# Patient Record
Sex: Male | Born: 1950 | Hispanic: No | Marital: Single
Health system: Southern US, Community
[De-identification: ages and names within clinical notes are randomized; demographics above are authoritative.]

## PROBLEM LIST (undated history)

## (undated) DIAGNOSIS — Z950 Presence of cardiac pacemaker: Secondary | ICD-10-CM

## (undated) DIAGNOSIS — F1910 Other psychoactive substance abuse, uncomplicated: Secondary | ICD-10-CM

## (undated) DIAGNOSIS — J918 Pleural effusion in other conditions classified elsewhere: Secondary | ICD-10-CM

## (undated) DIAGNOSIS — S14109S Unspecified injury at unspecified level of cervical spinal cord, sequela: Secondary | ICD-10-CM

## (undated) DIAGNOSIS — S065XAA Traumatic subdural hemorrhage with loss of consciousness status unknown, initial encounter: Secondary | ICD-10-CM

## (undated) DIAGNOSIS — I469 Cardiac arrest, cause unspecified: Secondary | ICD-10-CM

## (undated) DIAGNOSIS — S065X9A Traumatic subdural hemorrhage with loss of consciousness of unspecified duration, initial encounter: Secondary | ICD-10-CM

## (undated) DIAGNOSIS — J9621 Acute and chronic respiratory failure with hypoxia: Secondary | ICD-10-CM

## (undated) HISTORY — DX: Traumatic subdural hemorrhage with loss of consciousness of unspecified duration, initial encounter: S06.5X9A

## (undated) HISTORY — DX: Acute and chronic respiratory failure with hypoxia: J96.21

## (undated) HISTORY — DX: Liver disease, unspecified: J91.8

## (undated) HISTORY — DX: Cardiac arrest, cause unspecified: I46.9

## (undated) HISTORY — DX: Unspecified injury at unspecified level of cervical spinal cord, sequela: S14.109S

## (undated) HISTORY — DX: Traumatic subdural hemorrhage with loss of consciousness status unknown, initial encounter: S06.5XAA

## (undated) HISTORY — DX: Presence of cardiac pacemaker: Z95.0

## (undated) HISTORY — DX: Other psychoactive substance abuse, uncomplicated: F19.10

---

## 2018-12-06 ENCOUNTER — Other Ambulatory Visit (HOSPITAL_COMMUNITY): Payer: Medicaid Other | Admitting: Internal Medicine

## 2018-12-06 DIAGNOSIS — K769 Liver disease, unspecified: Secondary | ICD-10-CM

## 2018-12-06 DIAGNOSIS — J918 Pleural effusion in other conditions classified elsewhere: Secondary | ICD-10-CM

## 2018-12-06 DIAGNOSIS — F1910 Other psychoactive substance abuse, uncomplicated: Secondary | ICD-10-CM | POA: Diagnosis not present

## 2018-12-06 DIAGNOSIS — S065X0S Traumatic subdural hemorrhage without loss of consciousness, sequela: Secondary | ICD-10-CM

## 2018-12-06 DIAGNOSIS — S14109S Unspecified injury at unspecified level of cervical spinal cord, sequela: Secondary | ICD-10-CM

## 2018-12-06 DIAGNOSIS — J9621 Acute and chronic respiratory failure with hypoxia: Secondary | ICD-10-CM

## 2018-12-08 ENCOUNTER — Other Ambulatory Visit (HOSPITAL_COMMUNITY): Payer: Medicaid Other | Admitting: Internal Medicine

## 2018-12-08 DIAGNOSIS — S14109S Unspecified injury at unspecified level of cervical spinal cord, sequela: Secondary | ICD-10-CM

## 2018-12-08 DIAGNOSIS — J9621 Acute and chronic respiratory failure with hypoxia: Secondary | ICD-10-CM

## 2018-12-08 DIAGNOSIS — K769 Liver disease, unspecified: Secondary | ICD-10-CM

## 2018-12-08 DIAGNOSIS — S065X0S Traumatic subdural hemorrhage without loss of consciousness, sequela: Secondary | ICD-10-CM

## 2018-12-08 DIAGNOSIS — F1910 Other psychoactive substance abuse, uncomplicated: Secondary | ICD-10-CM | POA: Diagnosis not present

## 2018-12-08 DIAGNOSIS — J918 Pleural effusion in other conditions classified elsewhere: Secondary | ICD-10-CM | POA: Diagnosis not present

## 2018-12-09 ENCOUNTER — Other Ambulatory Visit (HOSPITAL_COMMUNITY): Payer: Medicaid Other | Admitting: Internal Medicine

## 2018-12-09 DIAGNOSIS — F1910 Other psychoactive substance abuse, uncomplicated: Secondary | ICD-10-CM

## 2018-12-09 DIAGNOSIS — S065X0S Traumatic subdural hemorrhage without loss of consciousness, sequela: Secondary | ICD-10-CM

## 2018-12-09 DIAGNOSIS — J918 Pleural effusion in other conditions classified elsewhere: Secondary | ICD-10-CM

## 2018-12-09 DIAGNOSIS — J9621 Acute and chronic respiratory failure with hypoxia: Secondary | ICD-10-CM

## 2018-12-09 DIAGNOSIS — S14109S Unspecified injury at unspecified level of cervical spinal cord, sequela: Secondary | ICD-10-CM

## 2018-12-09 DIAGNOSIS — K769 Liver disease, unspecified: Secondary | ICD-10-CM | POA: Diagnosis not present

## 2018-12-10 ENCOUNTER — Other Ambulatory Visit (HOSPITAL_COMMUNITY): Payer: Medicaid Other | Admitting: Internal Medicine

## 2018-12-10 ENCOUNTER — Encounter: Payer: Self-pay | Admitting: Internal Medicine

## 2018-12-10 DIAGNOSIS — F1910 Other psychoactive substance abuse, uncomplicated: Secondary | ICD-10-CM

## 2018-12-10 DIAGNOSIS — S14109S Unspecified injury at unspecified level of cervical spinal cord, sequela: Secondary | ICD-10-CM

## 2018-12-10 DIAGNOSIS — K769 Liver disease, unspecified: Secondary | ICD-10-CM | POA: Insufficient documentation

## 2018-12-10 DIAGNOSIS — J9621 Acute and chronic respiratory failure with hypoxia: Secondary | ICD-10-CM | POA: Insufficient documentation

## 2018-12-10 DIAGNOSIS — J918 Pleural effusion in other conditions classified elsewhere: Secondary | ICD-10-CM

## 2018-12-10 DIAGNOSIS — S065X0S Traumatic subdural hemorrhage without loss of consciousness, sequela: Secondary | ICD-10-CM

## 2018-12-10 DIAGNOSIS — S065XAA Traumatic subdural hemorrhage with loss of consciousness status unknown, initial encounter: Secondary | ICD-10-CM | POA: Insufficient documentation

## 2018-12-10 DIAGNOSIS — S065X9A Traumatic subdural hemorrhage with loss of consciousness of unspecified duration, initial encounter: Secondary | ICD-10-CM | POA: Insufficient documentation

## 2018-12-10 NOTE — Progress Notes (Signed)
[place] NOTE  PULMONARY SERVICE ROUNDS  Date of Service: 12/10/2018  Alexander Scott  DOB: January 21, 1951  Referring physician: Deanne Coffer, MD  HPI: Alexander Scott is a 67 y.o. male  being seen for Acute on Chronic Respiratory Failure.  Patient is still on the ventilator and full support has been on assist control mode is on 28% FiO2  Review of Systems: Unremarkable other than noted in HPI  Allergies:  Reviewed on the [place]  Medications: Reviewed  Vitals: Temperature 97.0 pulse 53 respiratory 20 blood pressure 130/78 saturations 100%  Ventilator Settings: Mode of ventilation assist control FiO2 28% tidal volume 405 PEEP 5  Physical Exam: . General:  calm and comfortable NAD . Eyes: normal lids, irises & conjunctiva . ENT: grossly normal tongue not enlarged . Neck: no masses . Cardiovascular: S1 S2 Normal no rubs no gallop . Respiratory: Coarse breath sounds with a few rhonchi . Abdomen: soft non-distended . Skin: no rash seen on limited exam . Musculoskeletal:  no rigidity . Psychiatric: unable to assess . Neurologic: no involuntary movements          Lab Data and radiological Data:  Sodium 135 potassium 3.8 BUN 26 creatinine 0.6   EXAM: Portable chest AP upright, 21 hours on 12/05/2018  INDICATION: Shortness of breath.  COMPARISON: 11/30/2018  TECHNIQUE: AP portable view of the chest was performed.  FINDINGS: Feeding tube has and removed. Overlying cardiac monitoring leads. Tracheostomy tube again noted.  The patient is tilted to the left. Cardiac silhouette mildly enlarged. Mediastinum remains stable.  No acute bony abnormalities demonstrated.  Small left pleural effusion. Atelectasis and infiltrates in both lung bases, more on the left, with interval change from previously. No right pleural effusion identified.  The left hilum is unremarkable. Right hilum is partially obscured by the adjacent right lower lung  opacities.  IMPRESSION:  1. New left pleural effusion. 2. Bibasilar atelectasis and infiltrates have become more prominent, more on the left side. This partially obscures the right hilum.  Electronically Signed by: Nicholes Stairs, MD, [facility] Radiology Electronically Signed on: 12/06/2018 2:50 AM  Assessment/Plan  Patient Active Problem List   Diagnosis Date Noted  . Acute on chronic respiratory failure with hypoxia ([place])   . Cervical spinal cord injury, sequela ([place])   . Subdural hematoma, post-traumatic ([place])   . Polysubstance abuse ([place])   . Pleural effusion associated with hepatic disorder       1. Acute on chronic respiratory failure with hypoxia patient continues to do fine on assist control mode which will be continued.  We will titrate oxygen as tolerated check.  RSB I if able to tolerate then try to wean. 2. Cervical spinal cord injury at baseline patient has residual paraplegia 3. Subdural hematoma posttraumatic we will continue with supportive care. 4. Polysubstance abuse at baseline we will continue present management no withdrawal 5. Pleural effusion is noted on the chest x-ray to monitor    I have personally evaluated the patient, evaluated the laboratory and imaging results and formulated the assessment and plan and placed orders as needed. The Patient requires high complexity decision making for assessment and support. I have discussed the patient on rounds with the Respiratory Staff   Allyne Gee, MD [facility] Pulmonary Critical Care Medicine

## 2018-12-10 NOTE — Progress Notes (Signed)
Alexander Scott  Date of Service: 12/10/2018  PULMONARY CONSULT   Alexander Scott  DGU:[id number]  DOB: [birth year]     Referring Physician: Deanne Coffer, MD  HPI: Alexander Scott is a 68 y.o. male seen for Acute on Chronic Respiratory Failure.  Patient has multiple medical problems including paraplegia polysubstance abuse bradycardia kidney failure alcoholic cirrhosis who presented to the hospital after being found not able to move his extremities both lower extremities.  Patient apparently had taken a fall on April 30.  At that time he had a tox screen that was positive for alcohol as well as cocaine.  Scans were done which were unremarkable of the spine as well as the brain.  Patient was hypotensive and was transferred to 3 weeks for further evaluation.  Patient apparently was found to be having a traumatic paraplegia had a C3-C6 laminectomy as well as fusion.  Also patient was found to have subdural hematoma however no neurosurgical intervention was done.  Patient did develop respiratory failure was intubated suffered a cardiac arrest also.  Patient was extubated however failed and then had to be reintubated again secondary to secretions.  Subsequently patient ended up having to have a tracheostomy because of retained tracheal secretions.  Right now patient was on the ventilator presentation in assist control mode on full support.  Review of Systems:  ROS performed and is unremarkable other than noted above.  Past medical history: Traumatic quadriplegia Alcohol abuse Polysubstance abuse Cirrhosis  Past surgical history: Tracheostomy Laminectomy and cervical fusion PEG tube placement Back surgery  Social history: Positive for heavy alcohol use positive for tobacco use  Family History: Non-Contributory to the present illness  Allergies  Reviewed on the [facility]  Medications: Reviewed on Rounds  Physical Exam:  Vitals: Temperature 95.0 pulse 62  respiratory 18 blood pressure 170/100 saturations 100%  Ventilator Settings mode of ventilation assist control FiO2 is 28% tidal volume 450  . General: Comfortable at this time . Eyes: Grossly normal lids, irises & conjunctiva . ENT: grossly tongue is normal . Neck: no obvious mass . Cardiovascular: S1-S2 normal no gallop or rub is noted . Respiratory: No rhonchi or rales are noted . Abdomen: Soft and nontender . Skin: no rash seen on limited exam . Musculoskeletal: not rigid . Psychiatric:unable to assess . Neurologic: no seizure no involuntary movements         Labs on Admission:  Sodium 135 potassium 3.8 BUN 26 creatinine 0.3 White count 6.6 hemoglobin 9.8 hematocrit 30.2 platelet count 232 ABG pH 7.41 PCO2 52 PO2 127  Radiological Exams on Admission: Chest film was done showing pleural effusion small  Assessment/Plan Patient Active Problem List   Diagnosis Date Noted  . Acute on chronic respiratory failure with hypoxia ([place])   . Cervical spinal cord injury, sequela ([place])   . Subdural hematoma, post-traumatic ([place])   . Polysubstance abuse ([place])   . Pleural effusion associated with hepatic disorder      1. Acute on chronic respiratory failure with hypoxia at this time patient is requiring full vent support will continue with assist control mode.  Respiratory therapy will assess the RSB I mechanics and try to begin weaning.  Patient's neurological issues may be a problem as far as being able to wean. 2. Cervical spinal injury status post fall patient is status post decompression and fusion.  We will continue with supportive care physical therapy will see the patient 3. Subdural hematoma no neurosurgical intervention required  4. History of polysubstance abuse we will continue to monitor the patient for withdrawal and continue with supportive care overall prognosis is guarded 5. Pleural effusion insignificant we will continue to monitor  I have personally seen and evaluated  the patient, evaluated laboratory and imaging results, formulated the assessment and plan and placed orders. The Patient requires high complexity decision making for assessment and support.  Case was discussed on Rounds with the Respiratory Therapy Staff Time Spent 62minutes  Allyne Gee, MD [facility]

## 2018-12-10 NOTE — Progress Notes (Signed)
[place] NOTE  PULMONARY SERVICE ROUNDS  Date of Service: 12/10/2018  Alexander Scott  DOB: 06-Dec-1950  Referring physician: Deanne Coffer, [name]  Patient was on pressure support wean checking spontaneous breathing trial numbers actually look pretty good  Review of Systems: Unremarkable other than noted in HPI  Allergies:  Reviewed on the [facility]  Medications: Reviewed  Vitals: Temperature 96.8 pulse 67 respiratory rate 20 blood pressure 142/70 saturations 100%  Ventilator Settings: Mode ventilation pressure support FiO2 28%  Physical Exam: . General:  calm and comfortable NAD . Eyes: normal lids, irises & conjunctiva . ENT: grossly normal tongue not enlarged . Neck: no masses . Cardiovascular: S1 S2 Normal no rubs no gallop . Respiratory: Coarse breath sounds no rhonchi . Abdomen: soft non-distended . Skin: no rash seen on limited exam . Musculoskeletal:  no rigidity . Psychiatric: unable to assess . Neurologic: no involuntary movements          Lab Data and radiological Data:  No labs to report  HISTORY:  Follow-up left pleural effusion and pulmonary infiltrate.  PROCEDURE:  AP PORTABLE VIEW OF CHEST  1353 hours  COMPARISON: 12/05/2018   FINDINGS:  Tracheostomy tube tip projects well above carina. Cardiomediastinal silhouette is stable. There are persistent bibasilar pulmonary opacities greater on the left than right. This has improved on the right, changes not apparent on the left.. There is elevation of the left hemidiaphragm. Left costophrenic angle appears less blunted, suggesting improvement in left-sided pleural effusion. Electrodes overlie the chest.  IMPRESSION:  1.  Improvement in right lung base airspace densities. 2. Persistent left base airspace densities without definite change although suspect left-sided pleural  effusion has decreased slightly.  Electronically Signed by: Willis Modena, MD, [facility] Radiology Electronically Signed on: 12/10/2018 2:42 PM  Assessment/Plan  Patient Active Problem List   Diagnosis Date Noted  . Acute on chronic respiratory failure with hypoxia ([place])   . Cervical spinal cord injury, sequela ([place])   . Subdural hematoma, post-traumatic ([place])   . Polysubstance abuse ([place])   . Pleural effusion associated with hepatic disorder       1. Acute on chronic respiratory failure with hypoxia we will continue with pressure support titrate oxygen continue pulmonary toilet. 2. Cervical spinal injury at baseline we will continue with supportive care status post laminectomy 3. Subdural hematoma no evacuation 4. Polysubstance abuse at baseline continue to monitor 5. Pleural effusion at baseline we will continue to follow actually shows improvement   I have personally evaluated the patient, evaluated the laboratory and imaging results and formulated the assessment and plan and placed orders as needed. The Patient requires high complexity decision making for assessment and support. I have discussed the patient on rounds with the Respiratory Staff   Allyne Gee, MD [facility] Pulmonary Critical Care Medicine

## 2018-12-10 NOTE — Progress Notes (Signed)
[name] NOTE  PULMONARY SERVICE ROUNDS  Date of Service: 12/10/2018  Alexander Scott  DOB: 1950-12-11  Referring physician: Deanne Coffer, [name]  Patient apparently coded is now stabilized patient is on the ventilator and full support on assist control mode  Review of Systems: Unremarkable other than noted in HPI  Allergies:  Reviewed on the [facility]  Medications: Reviewed  Vitals: Temperature 98.7 pulse 90 respiratory rate was 18 saturations 98%  Ventilator Settings: Mode of ventilation assist control FiO2 28% tidal volume is 400 PEEP 5  Physical Exam: . General:  calm and comfortable NAD . Eyes: normal lids, irises & conjunctiva . ENT: grossly normal tongue not enlarged . Neck: no masses . Cardiovascular: S1 S2 Normal no rubs no gallop . Respiratory: Coarse breath sounds with a few rhonchi . Abdomen: soft non-distended . Skin: no rash seen on limited exam . Musculoskeletal:  no rigidity . Psychiatric: unable to assess . Neurologic: no involuntary movements          Lab Data and radiological Data:  No labs to report today   Assessment/Plan  Patient Active Problem List   Diagnosis Date Noted  . Acute on chronic respiratory failure with hypoxia ([place])   . Cervical spinal cord injury, sequela ([place])   . Subdural hematoma, post-traumatic ([place])   . Polysubstance abuse ([place])   . Pleural effusion associated with hepatic disorder       1. Acute on chronic respiratory failure with hypoxia patient is going to continue with full vent support for now.  Will continue secretion management supportive care.  Hold off on any weaning. 2. Cervical spinal cord injury we will continue with supportive care and follow along. 3. Subdural hematoma posttraumatic stable no surgical intervention. 4. Polysubstance abuse at baseline we will follow along 5. Pleural  effusion stable we will monitor   I have personally evaluated the patient, evaluated the laboratory and imaging results and formulated the assessment and plan and placed orders as needed. The Patient requires high complexity decision making for assessment and support. I have discussed the patient on rounds with the Respiratory Staff   Allyne Gee, MD [facility] Pulmonary Critical Care Medicine

## 2018-12-11 ENCOUNTER — Other Ambulatory Visit (INDEPENDENT_AMBULATORY_CARE_PROVIDER_SITE_OTHER): Payer: Medicaid Other | Admitting: Internal Medicine

## 2018-12-11 DIAGNOSIS — F1910 Other psychoactive substance abuse, uncomplicated: Secondary | ICD-10-CM | POA: Diagnosis not present

## 2018-12-11 DIAGNOSIS — J918 Pleural effusion in other conditions classified elsewhere: Secondary | ICD-10-CM

## 2018-12-11 DIAGNOSIS — S14109S Unspecified injury at unspecified level of cervical spinal cord, sequela: Secondary | ICD-10-CM

## 2018-12-11 DIAGNOSIS — J9621 Acute and chronic respiratory failure with hypoxia: Secondary | ICD-10-CM

## 2018-12-11 DIAGNOSIS — S065X0S Traumatic subdural hemorrhage without loss of consciousness, sequela: Secondary | ICD-10-CM

## 2018-12-11 DIAGNOSIS — K769 Liver disease, unspecified: Secondary | ICD-10-CM

## 2018-12-11 NOTE — Progress Notes (Signed)
Alexander Scott NOTE  PULMONARY SERVICE ROUNDS  Date of Service: 12/11/2018  Alexander Scott  DOB: Aug 24, 1950  Referring physician: Deanne Coffer, [name]  Patient is on T collar right now comfortable without distress.  Review of Systems: Unremarkable other than noted in HPI  Allergies:  Reviewed on the [facility]  Medications: Reviewed  Vitals: Temperature is 96.4 pulse 60 respiratory 18 blood pressure 138/80 saturations 100%  Ventilator Settings: Off the ventilator on T collar  Physical Exam: . General:  calm and comfortable NAD . Eyes: normal lids, irises & conjunctiva . ENT: grossly normal tongue not enlarged . Neck: no masses . Cardiovascular: S1 S2 Normal no rubs no gallop . Respiratory: No rhonchi or rales are noted at this time . Abdomen: soft non-distended . Skin: no rash seen on limited exam . Musculoskeletal:  no rigidity . Psychiatric: unable to assess . Neurologic: no involuntary movements          Lab Data and radiological Data:  No labs to report today   Assessment/Plan  Patient Active Problem List   Diagnosis Date Noted  . Acute on chronic respiratory failure with hypoxia ([place])   . Cervical spinal cord injury, sequela ([place])   . Subdural hematoma, post-traumatic ([place])   . Polysubstance abuse ([place])   . Pleural effusion associated with hepatic disorder       1. Acute on chronic respiratory failure with hypoxia we will continue with T collar as tolerated titrate oxygen continue pulmonary toilet 2. Cervical spinal injury stable status post laminectomy 3. Subdural hematoma stable we will continue to monitor 4. Polysubstance abuse at baseline 5. Pleural effusion follow radiologically improving   I have personally evaluated the patient, evaluated the laboratory and imaging results and formulated the assessment and plan and placed orders as  needed. The Patient requires high complexity decision making for assessment and support. I have discussed the patient on rounds with the Respiratory Staff   Allyne Gee, MD [facility] Pulmonary Critical Care Medicine

## 2018-12-12 ENCOUNTER — Other Ambulatory Visit (INDEPENDENT_AMBULATORY_CARE_PROVIDER_SITE_OTHER): Payer: Medicaid Other | Admitting: Internal Medicine

## 2018-12-12 DIAGNOSIS — J918 Pleural effusion in other conditions classified elsewhere: Secondary | ICD-10-CM

## 2018-12-12 DIAGNOSIS — F1910 Other psychoactive substance abuse, uncomplicated: Secondary | ICD-10-CM

## 2018-12-12 DIAGNOSIS — K769 Liver disease, unspecified: Secondary | ICD-10-CM | POA: Diagnosis not present

## 2018-12-12 DIAGNOSIS — S14109S Unspecified injury at unspecified level of cervical spinal cord, sequela: Secondary | ICD-10-CM

## 2018-12-12 DIAGNOSIS — J9621 Acute and chronic respiratory failure with hypoxia: Secondary | ICD-10-CM | POA: Diagnosis not present

## 2018-12-12 DIAGNOSIS — S065X0S Traumatic subdural hemorrhage without loss of consciousness, sequela: Secondary | ICD-10-CM

## 2018-12-12 NOTE — Progress Notes (Signed)
[place] NOTE  PULMONARY SERVICE ROUNDS  Date of Service: 12/12/2018  Alexander Scott  DOB: May 14, 1951  Referring physician: Deanne Coffer, [name]  She remains on T collar right now is on 28% FiO2 good saturations are noted.  Review of Systems: Unremarkable other than noted in HPI  Allergies:  Reviewed on the [facility]  Medications: Reviewed  Vitals: 5 pulse 62 respiratory 15 blood pressure 110/70 saturations 100%  Ventilator Settings: Off the ventilator on T collar  Physical Exam: . General:  calm and comfortable NAD . Eyes: normal lids, irises & conjunctiva . ENT: grossly normal tongue not enlarged . Neck: no masses . Cardiovascular: S1 S2 Normal no rubs no gallop . Respiratory: No rhonchi or rales are noted at this time . Abdomen: soft non-distended . Skin: no rash seen on limited exam . Musculoskeletal:  no rigidity . Psychiatric: unable to assess . Neurologic: no involuntary movements          Lab Data and radiological Data:  Sodium 134 potassium 4.4 BUN 33 creatinine 0.4 White count 20.7 hemoglobin 9.6 hematocrit 28.8 platelet count 120   Assessment/Plan  Patient Active Problem List   Diagnosis Date Noted  . Acute on chronic respiratory failure with hypoxia ([place])   . Cervical spinal cord injury, sequela ([place])   . Subdural hematoma, post-traumatic ([place])   . Polysubstance abuse ([place])   . Pleural effusion associated with hepatic disorder       1. Acute on chronic respiratory failure with hypoxia we will continue T collar wean as tolerated. 2. Cervical spinal injury status post laminectomy and fusion 3. Subdural hematoma status post evacuation 4. Polysubstance abuse at baseline 5. Pleural effusion monitoring radiologically   I have personally evaluated the patient, evaluated the laboratory and imaging results and formulated the  assessment and plan and placed orders as needed. The Patient requires high complexity decision making for assessment and support. I have discussed the patient on rounds with the Respiratory Staff   Allyne Gee, MD [facility] Pulmonary Critical Care Medicine

## 2018-12-13 ENCOUNTER — Other Ambulatory Visit (INDEPENDENT_AMBULATORY_CARE_PROVIDER_SITE_OTHER): Payer: Medicaid Other | Admitting: Internal Medicine

## 2018-12-13 DIAGNOSIS — S065X0S Traumatic subdural hemorrhage without loss of consciousness, sequela: Secondary | ICD-10-CM

## 2018-12-13 DIAGNOSIS — J9621 Acute and chronic respiratory failure with hypoxia: Secondary | ICD-10-CM

## 2018-12-13 DIAGNOSIS — F1910 Other psychoactive substance abuse, uncomplicated: Secondary | ICD-10-CM

## 2018-12-13 DIAGNOSIS — J918 Pleural effusion in other conditions classified elsewhere: Secondary | ICD-10-CM

## 2018-12-13 DIAGNOSIS — K769 Liver disease, unspecified: Secondary | ICD-10-CM | POA: Diagnosis not present

## 2018-12-13 DIAGNOSIS — S14109S Unspecified injury at unspecified level of cervical spinal cord, sequela: Secondary | ICD-10-CM

## 2018-12-13 NOTE — Progress Notes (Signed)
[place] NOTE  PULMONARY SERVICE ROUNDS  Date of Service: 12/13/2018  Alexander Scott  DOB: [birth year]  Referring physician: Deanne Coffer, [name]  Patient is on full vent support at this time mechanics were poor not weaning  Review of Systems: Unremarkable other than noted in HPI  Allergies:  Reviewed on the [facility]  Medications: Reviewed  Vitals: Temperature 95.0 pulse 61 respiratory 18 blood pressure 148/80 saturations 100%  Ventilator Settings: Mode of ventilation assist control FiO2 28% tidal volume 472 PEEP 8  Physical Exam: . General:  calm and comfortable NAD . Eyes: normal lids, irises & conjunctiva . ENT: grossly normal tongue not enlarged . Neck: no masses . Cardiovascular: S1 S2 Normal no rubs no gallop . Respiratory: No rhonchi no rales are noted at this time . Abdomen: soft non-distended . Skin: no rash seen on limited exam . Musculoskeletal:  no rigidity . Psychiatric: unable to assess . Neurologic: no involuntary movements          Lab Data and radiological Data:  Sodium 132 potassium 3.9 BUN 29 creatinine 0.3 White count 21.7 hemoglobin 9.6 hematocrit 28.4 platelet count 119   Assessment/Plan  Patient Active Problem List   Diagnosis Date Noted  . Acute on chronic respiratory failure with hypoxia ([place])   . Cervical spinal cord injury, sequela ([place])   . Subdural hematoma, post-traumatic ([place])   . Polysubstance abuse ([place])   . Pleural effusion associated with hepatic disorder       1. Acute on chronic respiratory failure with hypoxia we will continue with full support on the ventilator reassess the spontaneous breathing trial 2. Cervical spinal cord injury fibrillation supportive care prognosis guarded 3. Subdural hematoma at baseline we will continue present management 4. Polysubstance abuse no change 5. Pleural effusion  supportive care   I have personally evaluated the patient, evaluated the laboratory and imaging results and formulated the assessment and plan and placed orders as needed. The Patient requires high complexity decision making for assessment and support. I have discussed the patient on rounds with the Respiratory Staff   Allyne Gee, MD [facility] Pulmonary Critical Care Medicine

## 2018-12-15 ENCOUNTER — Other Ambulatory Visit (HOSPITAL_COMMUNITY): Payer: Medicaid Other | Admitting: Internal Medicine

## 2018-12-15 DIAGNOSIS — J9621 Acute and chronic respiratory failure with hypoxia: Secondary | ICD-10-CM

## 2018-12-15 DIAGNOSIS — F1910 Other psychoactive substance abuse, uncomplicated: Secondary | ICD-10-CM | POA: Diagnosis not present

## 2018-12-15 DIAGNOSIS — S065X0S Traumatic subdural hemorrhage without loss of consciousness, sequela: Secondary | ICD-10-CM

## 2018-12-15 DIAGNOSIS — J918 Pleural effusion in other conditions classified elsewhere: Secondary | ICD-10-CM | POA: Diagnosis not present

## 2018-12-15 DIAGNOSIS — K769 Liver disease, unspecified: Secondary | ICD-10-CM

## 2018-12-15 DIAGNOSIS — S14109S Unspecified injury at unspecified level of cervical spinal cord, sequela: Secondary | ICD-10-CM

## 2018-12-15 NOTE — Progress Notes (Signed)
[place] NOTE  PULMONARY SERVICE ROUNDS  Date of Service: 12/15/2018  Alexander Scott  DOB: 1951-04-05  Referring physician: Deanne Coffer, [name]  Patient is on T collar has been on 28% FiO2 liberated from the ventilator  Review of Systems: Unremarkable other than noted in HPI  Allergies:  Reviewed on the [facility]  Medications: Reviewed  Vitals: Temperature 96.9 pulse 61 respiratory 18 blood pressure 156/74 saturations 100%  Ventilator Settings: Off the ventilator right now on T collar 28% FiO2  Physical Exam: . General:  calm and comfortable NAD . Eyes: normal lids, irises & conjunctiva . ENT: grossly normal tongue not enlarged . Neck: no masses . Cardiovascular: S1 S2 Normal no rubs no gallop . Respiratory: No rhonchi or rales are noted at this time . Abdomen: soft non-distended . Skin: no rash seen on limited exam . Musculoskeletal:  no rigidity . Psychiatric: unable to assess . Neurologic: no involuntary movements          Lab Data and radiological Data:  Sodium 137 potassium 3.8 BUN 26 creatinine 0.3 White count 8.4 hemoglobin 9.4 hematocrit 27.7 platelet count 120   Assessment/Plan  Patient Active Problem List   Diagnosis Date Noted  . Acute on chronic respiratory failure with hypoxia ([place])   . Cervical spinal cord injury, sequela ([place])   . Subdural hematoma, post-traumatic ([place])   . Polysubstance abuse ([place])   . Pleural effusion associated with hepatic disorder       1. Acute on chronic respiratory failure hypoxia continue with T collar patient has been liberated from the ventilator 2. Cervical injury high lesion we will continue with supportive care 3. Subdural hematoma stable continue to monitor 4. Polysubstance abuse patient is at baseline 5. Pleural effusion we will continue to follow along   I have personally evaluated the  patient, evaluated the laboratory and imaging results and formulated the assessment and plan and placed orders as needed. The Patient requires high complexity decision making for assessment and support. I have discussed the patient on rounds with the Respiratory Staff   Allyne Gee, MD [facility] Pulmonary Critical Care Medicine

## 2018-12-16 ENCOUNTER — Other Ambulatory Visit (HOSPITAL_COMMUNITY): Payer: Medicaid Other | Admitting: Internal Medicine

## 2018-12-16 DIAGNOSIS — J9621 Acute and chronic respiratory failure with hypoxia: Secondary | ICD-10-CM | POA: Diagnosis not present

## 2018-12-16 DIAGNOSIS — F1910 Other psychoactive substance abuse, uncomplicated: Secondary | ICD-10-CM

## 2018-12-16 DIAGNOSIS — J918 Pleural effusion in other conditions classified elsewhere: Secondary | ICD-10-CM

## 2018-12-16 DIAGNOSIS — K769 Liver disease, unspecified: Secondary | ICD-10-CM | POA: Diagnosis not present

## 2018-12-16 DIAGNOSIS — S14109S Unspecified injury at unspecified level of cervical spinal cord, sequela: Secondary | ICD-10-CM

## 2018-12-16 DIAGNOSIS — S065X0S Traumatic subdural hemorrhage without loss of consciousness, sequela: Secondary | ICD-10-CM

## 2018-12-16 NOTE — Progress Notes (Signed)
Alexander Scott NOTE  PULMONARY SERVICE ROUNDS  Date of Service: 12/16/2018  Alexander Scott  DOB: [birth year]  Referring physician: Deanne Coffer, [name]  Patient is currently on T collar has been on 28% FiO2 good saturations are noted  Review of Systems: Unremarkable other than noted in HPI  Allergies:  Reviewed on the [facility]  Medications: Reviewed  Vitals: Temperature 96.9 pulse 61 respiratory rate 18 blood pressure 156/74 saturations 100%  Ventilator Settings: Off the ventilator right now on T collar  Physical Exam: . General:  calm and comfortable NAD . Eyes: normal lids, irises & conjunctiva . ENT: grossly normal tongue not enlarged . Neck: no masses . Cardiovascular: S1 S2 Normal no rubs no gallop . Respiratory: Coarse breath sounds scattered rhonchi . Abdomen: soft non-distended . Skin: no rash seen on limited exam . Musculoskeletal:  no rigidity . Psychiatric: unable to assess . Neurologic: no involuntary movements          Lab Data and radiological Data:  ABG pH 7.406 PCO2 54 PO2 82   Assessment/Plan  Patient Active Problem List   Diagnosis Date Noted  . Acute on chronic respiratory failure with hypoxia ([place])   . Cervical spinal cord injury, sequela ([place])   . Subdural hematoma, post-traumatic ([place])   . Polysubstance abuse ([place])   . Pleural effusion associated with hepatic disorder       1. Acute on chronic respiratory failure with hypoxia we will continue with weaning on T collar patient is doing well so far.  Secretions are fair to moderate. 2. Cervical injury high lesion we will continue supportive care therapy as tolerated 3. Subdural hematoma stable we will monitor 4. Polysubstance abuse grossly unchanged 5. Pleural effusion we will continue with supportive care   I have personally evaluated the patient, evaluated the laboratory  and imaging results and formulated the assessment and plan and placed orders as needed. The Patient requires high complexity decision making for assessment and support. I have discussed the patient on rounds with the Respiratory Staff   Allyne Gee, MD [facility] Pulmonary Critical Care Medicine

## 2018-12-17 ENCOUNTER — Other Ambulatory Visit (HOSPITAL_COMMUNITY): Payer: Medicaid Other | Admitting: Internal Medicine

## 2018-12-17 DIAGNOSIS — S065X0S Traumatic subdural hemorrhage without loss of consciousness, sequela: Secondary | ICD-10-CM

## 2018-12-17 DIAGNOSIS — K769 Liver disease, unspecified: Secondary | ICD-10-CM

## 2018-12-17 DIAGNOSIS — J918 Pleural effusion in other conditions classified elsewhere: Secondary | ICD-10-CM

## 2018-12-17 DIAGNOSIS — J9621 Acute and chronic respiratory failure with hypoxia: Secondary | ICD-10-CM

## 2018-12-17 DIAGNOSIS — F1910 Other psychoactive substance abuse, uncomplicated: Secondary | ICD-10-CM

## 2018-12-17 DIAGNOSIS — S14109S Unspecified injury at unspecified level of cervical spinal cord, sequela: Secondary | ICD-10-CM

## 2018-12-17 NOTE — Progress Notes (Signed)
[place] NOTE  PULMONARY SERVICE ROUNDS  Date of Service: 12/17/2018  Alexander Scott  DOB: 1950/09/28  Referring physician: Deanne Coffer, [name]  Patient currently is on T collar good saturations are noted.  Secretions are fair to moderate  Review of Systems: Unremarkable other than noted in HPI  Allergies:  Reviewed on the [facility]  Medications: Reviewed  Vitals: Temperature 96.7 pulse 68 respiratory 18 blood pressure is 138/70 saturations 99%  Ventilator Settings: Off the ventilator right now on T collar  Physical Exam: . General:  calm and comfortable NAD . Eyes: normal lids, irises & conjunctiva . ENT: grossly normal tongue not enlarged . Neck: no masses . Cardiovascular: S1 S2 Normal no rubs no gallop . Respiratory: No rhonchi no rales are noted . Abdomen: soft non-distended . Skin: no rash seen on limited exam . Musculoskeletal:  no rigidity . Psychiatric: unable to assess . Neurologic: no involuntary movements          Lab Data and radiological Data:  No labs to report   Assessment/Plan  Patient Active Problem List   Diagnosis Date Noted  . Acute on chronic respiratory failure with hypoxia ([place])   . Cervical spinal cord injury, sequela ([place])   . Subdural hematoma, post-traumatic ([place])   . Polysubstance abuse ([place])   . Pleural effusion associated with hepatic disorder       1. Acute on chronic respiratory failure with hypoxia continue with T collar trials continue secretion management pulmonary toilet. 2. Cervical spinal injury at baseline we will continue present management 3. Subdural hematoma patient is at baseline we will continue with supportive care. 4. Polysubstance abuse at baseline 5. With fusion continue to monitor radiologically   I have personally evaluated the patient, evaluated the laboratory and imaging results and  formulated the assessment and plan and placed orders as needed. The Patient requires high complexity decision making for assessment and support. I have discussed the patient on rounds with the Respiratory Staff   Allyne Gee, MD [facility] Pulmonary Critical Care Medicine

## 2018-12-18 ENCOUNTER — Other Ambulatory Visit (HOSPITAL_COMMUNITY): Payer: Medicaid Other | Admitting: Internal Medicine

## 2018-12-18 DIAGNOSIS — J9621 Acute and chronic respiratory failure with hypoxia: Secondary | ICD-10-CM

## 2018-12-18 DIAGNOSIS — J918 Pleural effusion in other conditions classified elsewhere: Secondary | ICD-10-CM | POA: Diagnosis not present

## 2018-12-18 DIAGNOSIS — S14109S Unspecified injury at unspecified level of cervical spinal cord, sequela: Secondary | ICD-10-CM

## 2018-12-18 DIAGNOSIS — K769 Liver disease, unspecified: Secondary | ICD-10-CM | POA: Diagnosis not present

## 2018-12-18 DIAGNOSIS — F1910 Other psychoactive substance abuse, uncomplicated: Secondary | ICD-10-CM | POA: Diagnosis not present

## 2018-12-18 DIAGNOSIS — S065X0S Traumatic subdural hemorrhage without loss of consciousness, sequela: Secondary | ICD-10-CM

## 2018-12-18 NOTE — Progress Notes (Signed)
[name] NOTE  PULMONARY SERVICE ROUNDS  Date of Service: 12/18/2018  Alexander Scott  DOB: 08/29/1950  Referring physician: Deanne Coffer, [name]  Patient is on T collar right now has been on 35% FiO2 has a #6 cuffless trach in place after the change today  Review of Systems: Unremarkable other than noted in HPI  Allergies:  Reviewed on the [facility]  Medications: Reviewed  Vitals: Temperature 98.0 pulse 64 respiratory rate 20 blood pressure 110/66 saturations 94%  Ventilator Settings: Patient is currently off the ventilator on T collar  Physical Exam: . General:  calm and comfortable NAD . Eyes: normal lids, irises & conjunctiva . ENT: grossly normal tongue not enlarged . Neck: no masses . Cardiovascular: S1 S2 Normal no rubs no gallop . Respiratory: No rhonchi no rales are noted at this time . Abdomen: soft non-distended . Skin: no rash seen on limited exam . Musculoskeletal:  no rigidity . Psychiatric: unable to assess . Neurologic: no involuntary movements          Lab Data and radiological Data:  No labs to report today   Assessment/Plan  Patient Active Problem List   Diagnosis Date Noted  . Acute on chronic respiratory failure with hypoxia ([place])   . Cervical spinal cord injury, sequela ([place])   . Subdural hematoma, post-traumatic ([place])   . Polysubstance abuse ([place])   . Pleural effusion associated with hepatic disorder       1. Acute on chronic respiratory failure with hypoxia we will continue with T collar trials the trach has been changed to a cuffless trach continue with supportive care 2. Cervical spinal injury supportive care at high lesion 3. Subdural hematoma status post evacuation 4. Polysubstance abuse at baseline 5. Pleural effusion.  Continue to monitor radiologically   I have personally evaluated the patient, evaluated the  laboratory and imaging results and formulated the assessment and plan and placed orders as needed. The Patient requires high complexity decision making for assessment and support. I have discussed the patient on rounds with the Respiratory Staff   Allyne Gee, MD [facility] Pulmonary Critical Care Medicine

## 2018-12-20 ENCOUNTER — Other Ambulatory Visit (HOSPITAL_COMMUNITY): Payer: Medicaid Other | Admitting: Internal Medicine

## 2018-12-20 DIAGNOSIS — K769 Liver disease, unspecified: Secondary | ICD-10-CM

## 2018-12-20 DIAGNOSIS — J9621 Acute and chronic respiratory failure with hypoxia: Secondary | ICD-10-CM

## 2018-12-20 DIAGNOSIS — J918 Pleural effusion in other conditions classified elsewhere: Secondary | ICD-10-CM | POA: Diagnosis not present

## 2018-12-20 DIAGNOSIS — S065X0S Traumatic subdural hemorrhage without loss of consciousness, sequela: Secondary | ICD-10-CM

## 2018-12-20 DIAGNOSIS — F1910 Other psychoactive substance abuse, uncomplicated: Secondary | ICD-10-CM

## 2018-12-20 DIAGNOSIS — S14109S Unspecified injury at unspecified level of cervical spinal cord, sequela: Secondary | ICD-10-CM

## 2018-12-20 NOTE — Progress Notes (Signed)
[place] NOTE  PULMONARY SERVICE ROUNDS  Date of Service: 12/20/2018  Alexander Scott  DOB: [birth year]  Referring physician: Deanne Coffer, [name]  Patient currently is on T collar has been on 40% FiO2 still has some mucus plugging had some desaturations noted  Review of Systems: Unremarkable other than noted in HPI  Allergies:  Reviewed on the [facility]  Medications: Reviewed  Vitals: Temperature 96.2 pulse 86 respiratory 20 blood pressure 126/80 saturations 96%  Ventilator Settings: Off the ventilator on T collar currently  Physical Exam: . General:  calm and comfortable NAD . Eyes: normal lids, irises & conjunctiva . ENT: grossly normal tongue not enlarged . Neck: no masses . Cardiovascular: S1 S2 Normal no rubs no gallop . Respiratory: No rhonchi no rales . Abdomen: soft non-distended . Skin: no rash seen on limited exam . Musculoskeletal:  no rigidity . Psychiatric: unable to assess . Neurologic: no involuntary movements          Lab Data and radiological Data:  No labs to report   Assessment/Plan  Patient Active Problem List   Diagnosis Date Noted  . Acute on chronic respiratory failure with hypoxia ([place])   . Cervical spinal cord injury, sequela ([place])   . Subdural hematoma, post-traumatic ([place])   . Polysubstance abuse ([place])   . Pleural effusion associated with hepatic disorder       1. Acute on chronic respiratory failure with hypoxia we will continue with T collar continue aggressive pulmonary toilet supportive care. 2. Cervical spinal injury at baseline continue to monitor 3. Subdural hematoma posttraumatic we will continue to follow 4. Polysubstance abuse no change 5. Continue to follow radiologically   I have personally evaluated the patient, evaluated the laboratory and imaging results and formulated the assessment and plan and  placed orders as needed. The Patient requires high complexity decision making for assessment and support. I have discussed the patient on rounds with the Respiratory Staff   Allyne Gee, MD [facility] Pulmonary Critical Care Medicine

## 2018-12-22 ENCOUNTER — Other Ambulatory Visit (HOSPITAL_COMMUNITY): Payer: Medicaid Other | Admitting: Internal Medicine

## 2018-12-22 DIAGNOSIS — F1910 Other psychoactive substance abuse, uncomplicated: Secondary | ICD-10-CM | POA: Diagnosis not present

## 2018-12-22 DIAGNOSIS — J9621 Acute and chronic respiratory failure with hypoxia: Secondary | ICD-10-CM | POA: Diagnosis not present

## 2018-12-22 DIAGNOSIS — J918 Pleural effusion in other conditions classified elsewhere: Secondary | ICD-10-CM | POA: Diagnosis not present

## 2018-12-22 DIAGNOSIS — S065X0S Traumatic subdural hemorrhage without loss of consciousness, sequela: Secondary | ICD-10-CM

## 2018-12-22 DIAGNOSIS — K769 Liver disease, unspecified: Secondary | ICD-10-CM | POA: Diagnosis not present

## 2018-12-22 DIAGNOSIS — S14109S Unspecified injury at unspecified level of cervical spinal cord, sequela: Secondary | ICD-10-CM

## 2018-12-22 NOTE — Progress Notes (Signed)
[place] NOTE  PULMONARY SERVICE ROUNDS  Date of Service: 12/22/2018  Alexander Scott  DOB: 1951/04/13  Referring physician: Deanne Coffer, [name]  Patient currently is on T collar liberated from the ventilator still has been having issues with bradycardia and has not been able to be completely liberated.  Awaiting word from cardiology regarding the cardiac pacemaker   Review of Systems: Unremarkable other than noted in HPI  Allergies:  Reviewed on the [facility]  Medications: Reviewed  Vitals: Temperature 96.4 pulse 61 respiratory 20 blood pressure 138/94 saturations 100%  Ventilator Settings: Off the ventilator on T collar right now  Physical Exam: . General:  calm and comfortable NAD . Eyes: normal lids, irises & conjunctiva . ENT: grossly normal tongue not enlarged . Neck: no masses . Cardiovascular: S1 S2 Normal no rubs no gallop . Respiratory: No rhonchi no rales are noted . Abdomen: soft non-distended . Skin: no rash seen on limited exam . Musculoskeletal:  no rigidity . Psychiatric: unable to assess . Neurologic: no involuntary movements          Lab Data and radiological Data:  Sodium 141 potassium 4.7 BUN 51 creatinine 0.4   Assessment/Plan  Patient Active Problem List   Diagnosis Date Noted  . Acute on chronic respiratory failure with hypoxia ([place])   . Cervical spinal cord injury, sequela ([place])   . Subdural hematoma, post-traumatic ([place])   . Polysubstance abuse ([place])   . Pleural effusion associated with hepatic disorder       1. Acute on chronic respiratory failure with hypoxia we will continue with T collar trials continue secretion management pulmonary toilet 2. Cervical spinal injury grossly unchanged 3. Subdural hematoma at baseline we will continue to follow 4. Polysubstance abuse 5. Stable no change 6. Pleural effusion  follow-up x-ray was showing bilateral haziness probably some fluid present also still   I have personally evaluated the patient, evaluated the laboratory and imaging results and formulated the assessment and plan and placed orders as needed. The Patient requires high complexity decision making for assessment and support. I have discussed the patient on rounds with the Respiratory Staff   Allyne Gee, MD [facility] Pulmonary Critical Care Medicine

## 2018-12-31 ENCOUNTER — Encounter: Payer: Self-pay | Admitting: Internal Medicine

## 2018-12-31 ENCOUNTER — Other Ambulatory Visit (HOSPITAL_COMMUNITY): Payer: Medicaid Other | Admitting: Internal Medicine

## 2018-12-31 DIAGNOSIS — K769 Liver disease, unspecified: Secondary | ICD-10-CM | POA: Diagnosis not present

## 2018-12-31 DIAGNOSIS — I469 Cardiac arrest, cause unspecified: Secondary | ICD-10-CM

## 2018-12-31 DIAGNOSIS — Z950 Presence of cardiac pacemaker: Secondary | ICD-10-CM

## 2018-12-31 DIAGNOSIS — S14109S Unspecified injury at unspecified level of cervical spinal cord, sequela: Secondary | ICD-10-CM

## 2018-12-31 DIAGNOSIS — J9621 Acute and chronic respiratory failure with hypoxia: Secondary | ICD-10-CM

## 2018-12-31 DIAGNOSIS — F1910 Other psychoactive substance abuse, uncomplicated: Secondary | ICD-10-CM

## 2018-12-31 DIAGNOSIS — S065X0S Traumatic subdural hemorrhage without loss of consciousness, sequela: Secondary | ICD-10-CM

## 2018-12-31 DIAGNOSIS — J918 Pleural effusion in other conditions classified elsewhere: Secondary | ICD-10-CM

## 2018-12-31 NOTE — Progress Notes (Signed)
[place] NOTE  PULMONARY SERVICE ROUNDS  Date of Service: 12/31/2018  Alexander Scott  DOB: [birth year]  Referring physician: Deanne Coffer, [name]  Patient right now is on pressure support mode came back up after cardiac arrest status post pacemaker placement.  Review of Systems: Unremarkable other than noted in HPI  Allergies:  Reviewed on the [facility]  Medications: Reviewed  Vitals: Temperature 97.3 pulse 70 respiratory 16 blood pressure 120/78 saturations 99%  Ventilator Settings: Mode of ventilation pressure support FiO2 30% tidal volume 456 PEEP 5 per support 12  Physical Exam: . General:  calm and comfortable NAD . Eyes: normal lids, irises & conjunctiva . ENT: grossly normal tongue not enlarged . Neck: no masses . Cardiovascular: S1 S2 Normal no rubs no gallop . Respiratory: No rhonchi no rales are noted at this time . Abdomen: soft non-distended . Skin: no rash seen on limited exam . Musculoskeletal:  no rigidity . Psychiatric: unable to assess . Neurologic: no involuntary movements          Lab Data and radiological Data:  Sodium 135 potassium 3.7 BUN 14 creatinine 0.4 White count 6.7 hemoglobin 7.5 hematocrit 22.7 platelet count 317   Assessment/Plan  Patient Active Problem List   Diagnosis Date Noted  . Cardiac arrest ([place])   . S/P placement of cardiac pacemaker   . Acute on chronic respiratory failure with hypoxia ([place])   . Cervical spinal cord injury, sequela ([place])   . Subdural hematoma, post-traumatic ([place])   . Polysubstance abuse ([place])   . Pleural effusion associated with hepatic disorder       1. Acute on chronic respiratory failure hypoxia patient is on wean protocol right now is on pressure support 12/5 try to continue to advance his wean as tolerated. 2. Cervical spinal cord injury at baseline we will continue supportive  care 3. Cardiac arrest resolved with right now stable 4. Subdural hematoma at baseline we will continue present management 5. Status post pacemaker placement we will continue with supportive care cardiology follow-up 6. Polysubstance abuse at baseline we will continue present management 7. Pleural effusions monitor radiologically   I have personally evaluated the patient, evaluated the laboratory and imaging results and formulated the assessment and plan and placed orders as needed. The Patient requires high complexity decision making for assessment and support. I have discussed the patient on rounds with the Respiratory Staff   Allyne Gee, MD [facility] Pulmonary Critical Care Medicine

## 2019-01-01 ENCOUNTER — Other Ambulatory Visit (HOSPITAL_COMMUNITY): Payer: Medicare Other | Admitting: Internal Medicine

## 2019-01-01 DIAGNOSIS — J9621 Acute and chronic respiratory failure with hypoxia: Secondary | ICD-10-CM | POA: Diagnosis not present

## 2019-01-01 DIAGNOSIS — Z950 Presence of cardiac pacemaker: Secondary | ICD-10-CM

## 2019-01-01 DIAGNOSIS — S14109S Unspecified injury at unspecified level of cervical spinal cord, sequela: Secondary | ICD-10-CM

## 2019-01-01 DIAGNOSIS — S065X0S Traumatic subdural hemorrhage without loss of consciousness, sequela: Secondary | ICD-10-CM

## 2019-01-01 DIAGNOSIS — F1910 Other psychoactive substance abuse, uncomplicated: Secondary | ICD-10-CM

## 2019-01-01 DIAGNOSIS — I469 Cardiac arrest, cause unspecified: Secondary | ICD-10-CM | POA: Diagnosis not present

## 2019-01-01 DIAGNOSIS — K769 Liver disease, unspecified: Secondary | ICD-10-CM

## 2019-01-01 DIAGNOSIS — J918 Pleural effusion in other conditions classified elsewhere: Secondary | ICD-10-CM

## 2019-01-01 NOTE — Progress Notes (Signed)
[place] NOTE  PULMONARY SERVICE ROUNDS  Date of Service: 01/01/2019  Alexander Scott  DOB: 1951-03-17  Referring physician: Deanne Coffer, [name]  On full support and now on assist control mode has been on 30% FiO2  Review of Systems: Unremarkable other than noted in HPI  Allergies:  Reviewed on the [facility]  Medications: Reviewed  Vitals: Temperature 97.6 pulse 73 respiratory 18 blood pressure 128/64 saturations 99%  Ventilator Settings: Mode of ventilation assist control FiO2 30% tidal volume 400 PEEP 5  Physical Exam: . General:  calm and comfortable NAD . Eyes: normal lids, irises & conjunctiva . ENT: grossly normal tongue not enlarged . Neck: no masses . Cardiovascular: S1 S2 Normal no rubs no gallop . Respiratory: Scattered distant rhonchi . Abdomen: soft non-distended . Skin: no rash seen on limited exam . Musculoskeletal:  no rigidity . Psychiatric: unable to assess . Neurologic: no involuntary movements          Lab Data and radiological Data:  No labs to report   Assessment/Plan  Patient Active Problem List   Diagnosis Date Noted  . Cardiac arrest ([place])   . S/P placement of cardiac pacemaker   . Acute on chronic respiratory failure with hypoxia ([place])   . Cervical spinal cord injury, sequela ([place])   . Subdural hematoma, post-traumatic ([place])   . Polysubstance abuse ([place])   . Pleural effusion associated with hepatic disorder       1. Acute on chronic respiratory failure with hypoxia we will continue with checking the RSB I today hopefully should be able to resume weaning 2. Cardiac arrest rhythm stable we will continue his present management 3. Status post pacemaker placement stable 4. Cervical injury we will continue present management 5. Subdural hematoma at baseline 6. Polysubstance abuse at baseline continue with supportive  care   I have personally evaluated the patient, evaluated the laboratory and imaging results and formulated the assessment and plan and placed orders as needed. The Patient requires high complexity decision making for assessment and support. I have discussed the patient on rounds with the Respiratory Staff   Allyne Gee, MD [facility] Pulmonary Critical Care Medicine

## 2019-01-02 ENCOUNTER — Other Ambulatory Visit (HOSPITAL_COMMUNITY): Payer: Medicare Other | Admitting: Internal Medicine

## 2019-01-02 DIAGNOSIS — I469 Cardiac arrest, cause unspecified: Secondary | ICD-10-CM | POA: Diagnosis not present

## 2019-01-02 DIAGNOSIS — Z950 Presence of cardiac pacemaker: Secondary | ICD-10-CM

## 2019-01-02 DIAGNOSIS — S14109S Unspecified injury at unspecified level of cervical spinal cord, sequela: Secondary | ICD-10-CM

## 2019-01-02 DIAGNOSIS — K769 Liver disease, unspecified: Secondary | ICD-10-CM

## 2019-01-02 DIAGNOSIS — J9621 Acute and chronic respiratory failure with hypoxia: Secondary | ICD-10-CM | POA: Diagnosis not present

## 2019-01-02 DIAGNOSIS — J918 Pleural effusion in other conditions classified elsewhere: Secondary | ICD-10-CM

## 2019-01-02 DIAGNOSIS — F1910 Other psychoactive substance abuse, uncomplicated: Secondary | ICD-10-CM

## 2019-01-02 DIAGNOSIS — S065X0S Traumatic subdural hemorrhage without loss of consciousness, sequela: Secondary | ICD-10-CM

## 2019-01-02 NOTE — Progress Notes (Signed)
[place] NOTE  PULMONARY SERVICE ROUNDS  Date of Service: 01/02/2019  Alexander Scott  DOB: 02-May-1951  Referring physician: Deanne Coffer, [name]  Full support at this time on 30% FiO2 failed the RSB I once again  Review of Systems: Unremarkable other than noted in HPI  Allergies:  Reviewed on the [facility]  Medications: Reviewed  Vitals: Temperature 97.6 pulse 70 respiratory rate 20 blood pressure 138/80 saturations 100%  Ventilator Settings: Mode ventilation is assist control FiO2 30% tidal volume 460 PEEP 5  Physical Exam: . General:  calm and comfortable NAD . Eyes: normal lids, irises & conjunctiva . ENT: grossly normal tongue not enlarged . Neck: no masses . Cardiovascular: S1 S2 Normal no rubs no gallop . Respiratory: Coarse breath sounds no rhonchi . Abdomen: soft non-distended . Skin: no rash seen on limited exam . Musculoskeletal:  no rigidity . Psychiatric: unable to assess . Neurologic: no involuntary movements          Lab Data and radiological Data:  No labs to report   Assessment/Plan  Patient Active Problem List   Diagnosis Date Noted  . Cardiac arrest ([place])   . S/P placement of cardiac pacemaker   . Acute on chronic respiratory failure with hypoxia ([place])   . Cervical spinal cord injury, sequela ([place])   . Subdural hematoma, post-traumatic ([place])   . Polysubstance abuse ([place])   . Pleural effusion associated with hepatic disorder       1. Acute on chronic respiratory failure with hypoxia continue to assess the RSB I mechanics and spontaneous breathing trial continue pulmonary toilet supportive care 2. Cardiac pacemaker placement rhythm is been stable 3. Cardiac arrest rhythm stable continue to monitor 4. Cervical spinal cord injury at baseline 5. Subdural hematoma no changes noted 6. Polysubstance abuse at baseline 7. Pleural  effusion follow radiologically   I have personally evaluated the patient, evaluated the laboratory and imaging results and formulated the assessment and plan and placed orders as needed. The Patient requires high complexity decision making for assessment and support. I have discussed the patient on rounds with the Respiratory Staff   Allyne Gee, MD [facility] Pulmonary Critical Care Medicine

## 2019-01-03 ENCOUNTER — Other Ambulatory Visit (HOSPITAL_COMMUNITY): Payer: Medicare Other | Admitting: Internal Medicine

## 2019-01-03 DIAGNOSIS — F1910 Other psychoactive substance abuse, uncomplicated: Secondary | ICD-10-CM | POA: Diagnosis not present

## 2019-01-03 DIAGNOSIS — J918 Pleural effusion in other conditions classified elsewhere: Secondary | ICD-10-CM

## 2019-01-03 DIAGNOSIS — J9621 Acute and chronic respiratory failure with hypoxia: Secondary | ICD-10-CM | POA: Diagnosis not present

## 2019-01-03 DIAGNOSIS — I469 Cardiac arrest, cause unspecified: Secondary | ICD-10-CM

## 2019-01-03 DIAGNOSIS — Z950 Presence of cardiac pacemaker: Secondary | ICD-10-CM

## 2019-01-03 DIAGNOSIS — S065X0S Traumatic subdural hemorrhage without loss of consciousness, sequela: Secondary | ICD-10-CM

## 2019-01-03 DIAGNOSIS — K769 Liver disease, unspecified: Secondary | ICD-10-CM | POA: Diagnosis not present

## 2019-01-03 DIAGNOSIS — S14109S Unspecified injury at unspecified level of cervical spinal cord, sequela: Secondary | ICD-10-CM

## 2019-01-03 NOTE — Progress Notes (Signed)
[place] NOTE  PULMONARY SERVICE ROUNDS  Date of Service: 01/03/2019  Alexander Scott  DOB: 1951/04/20  Referring physician: Deanne Coffer, [name]  Remains on 35% FiO2 this morning was started on a wean on T collar  Review of Systems: Unremarkable other than noted in HPI  Allergies:  Reviewed on the [facility]  Medications: Reviewed  Vitals: Temperature 96.1 pulse 96 respiratory rate 17 blood pressure 130/80 saturations 100%  Ventilator Settings: Off the ventilator on T collar 35% FiO2  Physical Exam: . General:  calm and comfortable NAD . Eyes: normal lids, irises & conjunctiva . ENT: grossly normal tongue not enlarged . Neck: no masses . Cardiovascular: S1 S2 Normal no rubs no gallop . Respiratory: No rhonchi no rales are noted at this time . Abdomen: soft non-distended . Skin: no rash seen on limited exam . Musculoskeletal:  no rigidity . Psychiatric: unable to assess . Neurologic: no involuntary movements          Lab Data and radiological Data:  No labs to report   Assessment/Plan  Patient Active Problem List   Diagnosis Date Noted  . Cardiac arrest ([place])   . S/P placement of cardiac pacemaker   . Acute on chronic respiratory failure with hypoxia ([place])   . Cervical spinal cord injury, sequela ([place])   . Subdural hematoma, post-traumatic ([place])   . Polysubstance abuse ([place])   . Pleural effusion associated with hepatic disorder       1. Acute on chronic respiratory failure hypoxia we will continue to wean on T collar titrate oxygen continue pulmonary toilet 2. Cardiac respiratory stable we will continue with present management 3. Status post cardiac pacemaker appears to be working fine 4. Cervical spinal cord injury at baseline we will continue present management 5. Subdural hematoma no change 6. Polysubstance abuse at baseline 7. Pleural  effusion we will continue to monitor   I have personally evaluated the patient, evaluated the laboratory and imaging results and formulated the assessment and plan and placed orders as needed. The Patient requires high complexity decision making for assessment and support. I have discussed the patient on rounds with the Respiratory Staff   Allyne Gee, MD [facility] Pulmonary Critical Care Medicine

## 2019-01-05 ENCOUNTER — Other Ambulatory Visit (HOSPITAL_COMMUNITY): Payer: Medicare Other | Admitting: Internal Medicine

## 2019-01-05 DIAGNOSIS — I469 Cardiac arrest, cause unspecified: Secondary | ICD-10-CM | POA: Diagnosis not present

## 2019-01-05 DIAGNOSIS — F1910 Other psychoactive substance abuse, uncomplicated: Secondary | ICD-10-CM

## 2019-01-05 DIAGNOSIS — Z950 Presence of cardiac pacemaker: Secondary | ICD-10-CM

## 2019-01-05 DIAGNOSIS — S14109S Unspecified injury at unspecified level of cervical spinal cord, sequela: Secondary | ICD-10-CM

## 2019-01-05 DIAGNOSIS — J9621 Acute and chronic respiratory failure with hypoxia: Secondary | ICD-10-CM | POA: Diagnosis not present

## 2019-01-05 DIAGNOSIS — J918 Pleural effusion in other conditions classified elsewhere: Secondary | ICD-10-CM

## 2019-01-05 DIAGNOSIS — K769 Liver disease, unspecified: Secondary | ICD-10-CM | POA: Diagnosis not present

## 2019-01-05 DIAGNOSIS — S065X0S Traumatic subdural hemorrhage without loss of consciousness, sequela: Secondary | ICD-10-CM

## 2019-01-05 NOTE — Progress Notes (Signed)
[place] NOTE  PULMONARY SERVICE ROUNDS  Date of Service: 01/05/2019  RAFORD BRISSETT  DOB: [birth year]  Referring physician: Deanne Coffer, MD  HPI: DANDRE SISLER is a 68 y.o. male  being seen for Acute on Chronic Respiratory Failure.  Patient currently is on full support on assist control mode has been on 30% FiO2 patient did do 12 hours of T collar yesterday  Review of Systems: Unremarkable other than noted in HPI  Allergies:  Reviewed on the [facility]  Medications: Reviewed  Vitals: Temperature 97.6 pulse 85 respiratory 18 blood pressure 159/89 saturations 95%  Ventilator Settings: Mode of ventilation assist control FiO2 30% tidal volume 400 PEEP 5  Physical Exam: . General:  calm and comfortable NAD . Eyes: normal lids, irises & conjunctiva . ENT: grossly normal tongue not enlarged . Neck: no masses . Cardiovascular: S1 S2 Normal no rubs no gallop . Respiratory: No rhonchi no rales are noted at this time . Abdomen: soft non-distended . Skin: no rash seen on limited exam . Musculoskeletal:  no rigidity . Psychiatric: unable to assess . Neurologic: no involuntary movements          Lab Data and radiological Data:  White count 9.0 hemoglobin 7.5 hematocrit 23.2 platelet count 442   Assessment/Plan  Patient Active Problem List   Diagnosis Date Noted  . Cardiac arrest ([place])   . S/P placement of cardiac pacemaker   . Acute on chronic respiratory failure with hypoxia ([place])   . Cervical spinal cord injury, sequela ([place])   . Subdural hematoma, post-traumatic ([place])   . Polysubstance abuse ([place])   . Pleural effusion associated with hepatic disorder       1. Acute on chronic respiratory failure with hypoxia we will continue to wean the patient on T collar as mentioned yesterday patient did 12 hours of weaning. 2. Cardiac arrest rhythm is stable we will continue with supportive care 3. Status post pacemaker placement doing well we will  continue to monitor 4. Cervical spinal injury grossly unchanged 5. Subdural hematoma patient remains grossly unchanged 6. Polysubstance abuse at baseline 7. Pleural effusion we will monitor radiologically   I have personally evaluated the patient, evaluated the laboratory and imaging results and formulated the assessment and plan and placed orders as needed. The Patient requires high complexity decision making for assessment and support. I have discussed the patient on rounds with the Respiratory Staff   Allyne Gee, MD [facility] Pulmonary Critical Care Medicine

## 2019-01-07 ENCOUNTER — Other Ambulatory Visit (HOSPITAL_COMMUNITY): Payer: Medicare Other | Admitting: Internal Medicine

## 2019-01-07 DIAGNOSIS — F1910 Other psychoactive substance abuse, uncomplicated: Secondary | ICD-10-CM

## 2019-01-07 DIAGNOSIS — J9621 Acute and chronic respiratory failure with hypoxia: Secondary | ICD-10-CM | POA: Diagnosis not present

## 2019-01-07 DIAGNOSIS — S14109S Unspecified injury at unspecified level of cervical spinal cord, sequela: Secondary | ICD-10-CM

## 2019-01-07 DIAGNOSIS — K769 Liver disease, unspecified: Secondary | ICD-10-CM

## 2019-01-07 DIAGNOSIS — I469 Cardiac arrest, cause unspecified: Secondary | ICD-10-CM | POA: Diagnosis not present

## 2019-01-07 DIAGNOSIS — Z950 Presence of cardiac pacemaker: Secondary | ICD-10-CM

## 2019-01-07 DIAGNOSIS — J918 Pleural effusion in other conditions classified elsewhere: Secondary | ICD-10-CM

## 2019-01-07 DIAGNOSIS — S065X0S Traumatic subdural hemorrhage without loss of consciousness, sequela: Secondary | ICD-10-CM

## 2019-01-07 NOTE — Progress Notes (Signed)
[place] NOTE  PULMONARY SERVICE ROUNDS  Date of Service: 01/07/2019  Alexander Scott  DOB: June 28, 1951  Referring physician: Deanne Coffer, [name]  Patient was able to do about 9 hours yesterday on T collar this morning was on the RSB I and looked good  Review of Systems: Unremarkable other than noted in HPI  Allergies:  Reviewed on the [facility]  Medications: Reviewed  Vitals: Temperature 99.0 pulse 73 respiratory rate 16 blood pressure 156/97 saturations 100%  Ventilator Settings: On the ventilator pressure support FiO2 30% tidal line 381  Physical Exam: . General:  calm and comfortable NAD . Eyes: normal lids, irises & conjunctiva . ENT: grossly normal tongue not enlarged . Neck: no masses . Cardiovascular: S1 S2 Normal no rubs no gallop . Respiratory: No rhonchi no rales are noted at this time . Abdomen: soft non-distended . Skin: no rash seen on limited exam . Musculoskeletal:  no rigidity . Psychiatric: unable to assess . Neurologic: no involuntary movements          Lab Data and radiological Data:  No labs to report   Assessment/Plan  Patient Active Problem List   Diagnosis Date Noted  . Cardiac arrest ([place])   . S/P placement of cardiac pacemaker   . Acute on chronic respiratory failure with hypoxia ([place])   . Cervical spinal cord injury, sequela ([place])   . Subdural hematoma, post-traumatic ([place])   . Polysubstance abuse ([place])   . Pleural effusion associated with hepatic disorder       1. Acute on chronic respiratory failure with hypoxia we will continue with weaning attempts will be placed on T collar once again today will advance as tolerated 2. Cardiac arrest rhythm is stable we will continue to monitor 3. Status post cardiac pacemaker placement stable we will continue to follow 4. Cervical spinal cord injury at baseline 5. Subdural  hematoma status post neuro management supportive care 6. Pleural effusions at baseline 7. Polysubstance abuse at baseline   I have personally evaluated the patient, evaluated the laboratory and imaging results and formulated the assessment and plan and placed orders as needed. The Patient requires high complexity decision making for assessment and support. I have discussed the patient on rounds with the Respiratory Staff   Allyne Gee, MD [facility] Pulmonary Critical Care Medicine

## 2019-01-08 ENCOUNTER — Other Ambulatory Visit (HOSPITAL_COMMUNITY): Payer: Medicare Other | Admitting: Internal Medicine

## 2019-01-08 DIAGNOSIS — S065X0S Traumatic subdural hemorrhage without loss of consciousness, sequela: Secondary | ICD-10-CM

## 2019-01-08 DIAGNOSIS — J9621 Acute and chronic respiratory failure with hypoxia: Secondary | ICD-10-CM | POA: Diagnosis not present

## 2019-01-08 DIAGNOSIS — K769 Liver disease, unspecified: Secondary | ICD-10-CM

## 2019-01-08 DIAGNOSIS — F1910 Other psychoactive substance abuse, uncomplicated: Secondary | ICD-10-CM

## 2019-01-08 DIAGNOSIS — J918 Pleural effusion in other conditions classified elsewhere: Secondary | ICD-10-CM

## 2019-01-08 DIAGNOSIS — S14109S Unspecified injury at unspecified level of cervical spinal cord, sequela: Secondary | ICD-10-CM

## 2019-01-08 DIAGNOSIS — Z950 Presence of cardiac pacemaker: Secondary | ICD-10-CM

## 2019-01-08 DIAGNOSIS — I469 Cardiac arrest, cause unspecified: Secondary | ICD-10-CM | POA: Diagnosis not present

## 2019-01-08 NOTE — Progress Notes (Signed)
[name]  PULMONARY SERVICE ROUNDS  Date of Service: 01/08/2019  Alexander Scott  DOB: Jan 02, 1951  Referring physician: Deanne Coffer, [name]  Patient is currently on full support has been passing the RSB I was noted to have some desaturations on 30% FiO2  Review of Systems: Unremarkable other than noted in HPI  Allergies:  Reviewed on the [facility]  Medications: Reviewed  Vitals: Temperature 97.1 pulse 81 respiratory rate 20 blood pressure 144/78 saturations 100%  Ventilator Settings: Mode ventilation assist control FiO2 30% tidal volume is 400 PEEP 5  Physical Exam: . General:  calm and comfortable NAD . Eyes: normal lids, irises & conjunctiva . ENT: grossly normal tongue not enlarged . Neck: no masses . Cardiovascular: S1 S2 Normal no rubs no gallop . Respiratory: Scattered rhonchi noted bilaterally . Abdomen: soft non-distended . Skin: no rash seen on limited exam . Musculoskeletal:  no rigidity . Psychiatric: unable to assess . Neurologic: no involuntary movements          Lab Data and radiological Data:  No labs to report today   Assessment/Plan  Patient Active Problem List   Diagnosis Date Noted  . Cardiac arrest ([place])   . S/P placement of cardiac pacemaker   . Acute on chronic respiratory failure with hypoxia ([place])   . Cervical spinal cord injury, sequela ([place])   . Subdural hematoma, post-traumatic ([place])   . Polysubstance abuse ([place])   . Pleural effusion associated with hepatic disorder       1. Acute on chronic respiratory failure with hypoxia we will continue with full support on the ventilator check the RSB I and begin weaning.  If patient has desaturations increase FiO2 to 35% 2. Status post cardiac pacemaker placement continue with present management 3. Cardiac arrest rhythm is stable at this time 4. Cervical spinal cord  injury at baseline continue to monitor 5. Subdural hematoma no changes noted 6. Polysubstance abuse at baseline continue to follow   I have personally evaluated the patient, evaluated the laboratory and imaging results and formulated the assessment and plan and placed orders as needed. The Patient requires high complexity decision making for assessment and support. I have discussed the patient on rounds with the Respiratory Staff   Allyne Gee, MD [facility] Pulmonary Critical Care Medicine

## 2019-01-10 ENCOUNTER — Other Ambulatory Visit (HOSPITAL_COMMUNITY): Payer: Medicare Other | Admitting: Internal Medicine

## 2019-01-10 DIAGNOSIS — S14109S Unspecified injury at unspecified level of cervical spinal cord, sequela: Secondary | ICD-10-CM

## 2019-01-10 DIAGNOSIS — K769 Liver disease, unspecified: Secondary | ICD-10-CM

## 2019-01-10 DIAGNOSIS — I469 Cardiac arrest, cause unspecified: Secondary | ICD-10-CM | POA: Diagnosis not present

## 2019-01-10 DIAGNOSIS — J9621 Acute and chronic respiratory failure with hypoxia: Secondary | ICD-10-CM

## 2019-01-10 DIAGNOSIS — F1910 Other psychoactive substance abuse, uncomplicated: Secondary | ICD-10-CM

## 2019-01-10 DIAGNOSIS — S065X0S Traumatic subdural hemorrhage without loss of consciousness, sequela: Secondary | ICD-10-CM

## 2019-01-10 DIAGNOSIS — Z950 Presence of cardiac pacemaker: Secondary | ICD-10-CM

## 2019-01-10 DIAGNOSIS — J918 Pleural effusion in other conditions classified elsewhere: Secondary | ICD-10-CM

## 2019-01-10 NOTE — Progress Notes (Signed)
[place] NOTE  PULMONARY SERVICE ROUNDS  Date of Service: 01/10/2019  Alexander Scott  DOB: June 30, 1951  Referring physician: Deanne Coffer, [name]  Patient remains on pressure support has been tolerating it well we should be able to try to advance to wean to T collar  Review of Systems: Unremarkable other than noted in HPI  Allergies:  Reviewed on the [facility]  Medications: Reviewed  Vitals: Temperature 98.1 pulse 86 respiratory 18 blood pressure 120/74 saturations 99%  Ventilator Settings: Mode of ventilation pressure support FiO2 30% tidal volume 546 pressure support 5 PEEP 5  Physical Exam: . General:  calm and comfortable NAD . Eyes: normal lids, irises & conjunctiva . ENT: grossly normal tongue not enlarged . Neck: no masses . Cardiovascular: S1 S2 Normal no rubs no gallop . Respiratory: No rhonchi coarse breath sounds . Abdomen: soft non-distended . Skin: no rash seen on limited exam . Musculoskeletal:  no rigidity . Psychiatric: unable to assess . Neurologic: no involuntary movements          Lab Data and radiological Data:  No labs to report   Assessment/Plan  Patient Active Problem List   Diagnosis Date Noted  . Cardiac arrest ([place])   . S/P placement of cardiac pacemaker   . Acute on chronic respiratory failure with hypoxia ([place])   . Cervical spinal cord injury, sequela ([place])   . Subdural hematoma, post-traumatic ([place])   . Polysubstance abuse ([place])   . Pleural effusion associated with hepatic disorder       1. Acute on chronic respiratory failure with hypoxia patient is doing well with the RSB I advanced to T collar. 2. Status post cardiac arrest and pacemaker placement rhythm has been stable we will continue to monitor 3. Cervical spinal cord injury at baseline 4. Subdural hematoma grossly unchanged 5. Polysubstance abuse patient  is at baseline 6. Pleural effusion at baseline continue to monitor   I have personally evaluated the patient, evaluated the laboratory and imaging results and formulated the assessment and plan and placed orders as needed. The Patient requires high complexity decision making for assessment and support. I have discussed the patient on rounds with the Respiratory Staff   Allyne Gee, MD [facility] Pulmonary Critical Care Medicine

## 2019-01-12 ENCOUNTER — Other Ambulatory Visit (HOSPITAL_COMMUNITY): Payer: Medicare Other | Admitting: Internal Medicine

## 2019-01-12 DIAGNOSIS — I469 Cardiac arrest, cause unspecified: Secondary | ICD-10-CM | POA: Diagnosis not present

## 2019-01-12 DIAGNOSIS — F1910 Other psychoactive substance abuse, uncomplicated: Secondary | ICD-10-CM | POA: Diagnosis not present

## 2019-01-12 DIAGNOSIS — Z950 Presence of cardiac pacemaker: Secondary | ICD-10-CM

## 2019-01-12 DIAGNOSIS — J9621 Acute and chronic respiratory failure with hypoxia: Secondary | ICD-10-CM

## 2019-01-12 DIAGNOSIS — S14109S Unspecified injury at unspecified level of cervical spinal cord, sequela: Secondary | ICD-10-CM

## 2019-01-12 DIAGNOSIS — K769 Liver disease, unspecified: Secondary | ICD-10-CM

## 2019-01-12 DIAGNOSIS — S065X0S Traumatic subdural hemorrhage without loss of consciousness, sequela: Secondary | ICD-10-CM

## 2019-01-12 DIAGNOSIS — J918 Pleural effusion in other conditions classified elsewhere: Secondary | ICD-10-CM

## 2019-01-12 NOTE — Progress Notes (Signed)
[place] NOTE  PULMONARY SERVICE ROUNDS  Date of Service: 01/12/2019  Alexander Scott  DOB: 10-Apr-1951  Referring physician: Deanne Coffer, [name]  Patient is on T collar right now is been on 40% FiO2 does complain about 24 hours  Review of Systems: Unremarkable other than noted in HPI  Allergies:  Reviewed on the [facility]  Medications: Reviewed  Vitals: Temperature 96.6 pulse 85 respiratory 18 blood pressure 160/90 saturations 98%  Ventilator Settings: Off the ventilator right now on T collar  Physical Exam: . General:  calm and comfortable NAD . Eyes: normal lids, irises & conjunctiva . ENT: grossly normal tongue not enlarged . Neck: no masses . Cardiovascular: S1 S2 Normal no rubs no gallop . Respiratory: No rhonchi no rales are noted at this time . Abdomen: soft non-distended . Skin: no rash seen on limited exam . Musculoskeletal:  no rigidity . Psychiatric: unable to assess . Neurologic: no involuntary movements          Lab Data and radiological Data:  No labs to report   Assessment/Plan  Patient Active Problem List   Diagnosis Date Noted  . Cardiac arrest ([place])   . S/P placement of cardiac pacemaker   . Acute on chronic respiratory failure with hypoxia ([place])   . Cervical spinal cord injury, sequela ([place])   . Subdural hematoma, post-traumatic ([place])   . Polysubstance abuse ([place])   . Pleural effusion associated with hepatic disorder       1. Acute on chronic respiratory failure with hypoxia we will continue with for supportive care advance the wean as tolerated 2. cardiac arrest rhythm is stable we will continue with supportive care 3. Status post cardiac pacemaker placement continue with present management 4. Cervical spinal cord injury at baseline 5. Subdural hematoma status post evacuation 6. Polysubstance abuse no changes  noted 7. Pleural effusion we will monitor radiologically   I have personally evaluated the patient, evaluated the laboratory and imaging results and formulated the assessment and plan and placed orders as needed. The Patient requires high complexity decision making for assessment and support. I have discussed the patient on rounds with the Respiratory Staff   Allyne Gee, MD [facility] Pulmonary Critical Care Medicine

## 2019-01-13 ENCOUNTER — Other Ambulatory Visit (HOSPITAL_COMMUNITY): Payer: Medicare Other | Admitting: Internal Medicine

## 2019-01-13 DIAGNOSIS — F1910 Other psychoactive substance abuse, uncomplicated: Secondary | ICD-10-CM

## 2019-01-13 DIAGNOSIS — S14109S Unspecified injury at unspecified level of cervical spinal cord, sequela: Secondary | ICD-10-CM

## 2019-01-13 DIAGNOSIS — K769 Liver disease, unspecified: Secondary | ICD-10-CM | POA: Diagnosis not present

## 2019-01-13 DIAGNOSIS — I469 Cardiac arrest, cause unspecified: Secondary | ICD-10-CM | POA: Diagnosis not present

## 2019-01-13 DIAGNOSIS — J9621 Acute and chronic respiratory failure with hypoxia: Secondary | ICD-10-CM | POA: Diagnosis not present

## 2019-01-13 DIAGNOSIS — Z950 Presence of cardiac pacemaker: Secondary | ICD-10-CM

## 2019-01-13 DIAGNOSIS — S065X0S Traumatic subdural hemorrhage without loss of consciousness, sequela: Secondary | ICD-10-CM

## 2019-01-13 DIAGNOSIS — J918 Pleural effusion in other conditions classified elsewhere: Secondary | ICD-10-CM

## 2019-01-13 NOTE — Progress Notes (Signed)
[place] NOTE  PULMONARY SERVICE ROUNDS  Date of Service: 01/13/2019  Alexander Scott  DOB: 1951-05-08  Referring physician: Deanne Coffer, [name]  Patient is on T collar has been off the ventilator for more than 48 hours now  Review of Systems: Unremarkable other than noted in HPI  Allergies:  Reviewed on the [facility]  Medications: Reviewed  Vitals: Temperature 97.6 pulse 91 respiratory rate 22 blood pressure 150/90 saturations 99%  Ventilator Settings: Off the ventilator on T collar  Physical Exam: . General:  calm and comfortable NAD . Eyes: normal lids, irises & conjunctiva . ENT: grossly normal tongue not enlarged . Neck: no masses . Cardiovascular: S1 S2 Normal no rubs no gallop . Respiratory: No rhonchi coarse breath sounds . Abdomen: soft non-distended . Skin: no rash seen on limited exam . Musculoskeletal:  no rigidity . Psychiatric: unable to assess . Neurologic: no involuntary movements          Lab Data and radiological Data:  Sodium 147 potassium 4.4 BUN 54 creatinine 0.9   Assessment/Plan  Patient Active Problem List   Diagnosis Date Noted  . Cardiac arrest ([place])   . S/P placement of cardiac pacemaker   . Acute on chronic respiratory failure with hypoxia ([place])   . Cervical spinal cord injury, sequela ([place])   . Subdural hematoma, post-traumatic ([place])   . Polysubstance abuse ([place])   . Pleural effusion associated with hepatic disorder       1. Acute on chronic respiratory failure with hypoxia we will continue with T collar trials continue secretion management pulmonary toilet 2. Cardiac arrest rhythm is stable 3. Status post cardiac pacemaker we will continue to monitor 4. Cervical spinal injury at baseline 5. Subdural hematoma stable 6. Polysubstance abuse at baseline   I have personally evaluated the patient, evaluated the  laboratory and imaging results and formulated the assessment and plan and placed orders as needed. The Patient requires high complexity decision making for assessment and support. I have discussed the patient on rounds with the Respiratory Staff   Allyne Gee, MD [facility] Pulmonary Critical Care Medicine

## 2019-02-18 ENCOUNTER — Other Ambulatory Visit: Payer: Self-pay

## 2019-02-18 ENCOUNTER — Inpatient Hospital Stay (HOSPITAL_COMMUNITY)
Admission: EM | Admit: 2019-02-18 | Discharge: 2019-02-21 | DRG: 871 | Disposition: A | Discharge status: Skilled Nursing Facility | Payer: Medicare Other | Source: Other Acute Inpatient Hospital | Attending: Internal Medicine | Admitting: Internal Medicine

## 2019-02-18 ENCOUNTER — Encounter (HOSPITAL_COMMUNITY): Payer: Self-pay | Admitting: Emergency Medicine

## 2019-02-18 ENCOUNTER — Emergency Department (HOSPITAL_COMMUNITY): Payer: Medicare Other

## 2019-02-18 DIAGNOSIS — I361 Nonrheumatic tricuspid (valve) insufficiency: Secondary | ICD-10-CM | POA: Diagnosis not present

## 2019-02-18 DIAGNOSIS — E875 Hyperkalemia: Secondary | ICD-10-CM | POA: Diagnosis present

## 2019-02-18 DIAGNOSIS — L89616 Pressure-induced deep tissue damage of right heel: Secondary | ICD-10-CM | POA: Diagnosis present

## 2019-02-18 DIAGNOSIS — R6521 Severe sepsis with septic shock: Secondary | ICD-10-CM | POA: Diagnosis present

## 2019-02-18 DIAGNOSIS — R8271 Bacteriuria: Secondary | ICD-10-CM | POA: Diagnosis not present

## 2019-02-18 DIAGNOSIS — Z931 Gastrostomy status: Secondary | ICD-10-CM | POA: Diagnosis not present

## 2019-02-18 DIAGNOSIS — J9690 Respiratory failure, unspecified, unspecified whether with hypoxia or hypercapnia: Secondary | ICD-10-CM

## 2019-02-18 DIAGNOSIS — Z66 Do not resuscitate: Secondary | ICD-10-CM | POA: Diagnosis present

## 2019-02-18 DIAGNOSIS — A419 Sepsis, unspecified organism: Principal | ICD-10-CM | POA: Diagnosis present

## 2019-02-18 DIAGNOSIS — J151 Pneumonia due to Pseudomonas: Secondary | ICD-10-CM | POA: Diagnosis not present

## 2019-02-18 DIAGNOSIS — B965 Pseudomonas (aeruginosa) (mallei) (pseudomallei) as the cause of diseases classified elsewhere: Secondary | ICD-10-CM | POA: Diagnosis not present

## 2019-02-18 DIAGNOSIS — D649 Anemia, unspecified: Secondary | ICD-10-CM | POA: Diagnosis present

## 2019-02-18 DIAGNOSIS — G8220 Paraplegia, unspecified: Secondary | ICD-10-CM | POA: Diagnosis present

## 2019-02-18 DIAGNOSIS — Z7189 Other specified counseling: Secondary | ICD-10-CM

## 2019-02-18 DIAGNOSIS — Z6820 Body mass index (BMI) 20.0-20.9, adult: Secondary | ICD-10-CM

## 2019-02-18 DIAGNOSIS — Z87820 Personal history of traumatic brain injury: Secondary | ICD-10-CM

## 2019-02-18 DIAGNOSIS — N390 Urinary tract infection, site not specified: Secondary | ICD-10-CM | POA: Diagnosis present

## 2019-02-18 DIAGNOSIS — L89153 Pressure ulcer of sacral region, stage 3: Secondary | ICD-10-CM | POA: Diagnosis present

## 2019-02-18 DIAGNOSIS — J9612 Chronic respiratory failure with hypercapnia: Secondary | ICD-10-CM | POA: Diagnosis present

## 2019-02-18 DIAGNOSIS — R319 Hematuria, unspecified: Secondary | ICD-10-CM | POA: Diagnosis present

## 2019-02-18 DIAGNOSIS — E871 Hypo-osmolality and hyponatremia: Secondary | ICD-10-CM | POA: Diagnosis present

## 2019-02-18 DIAGNOSIS — A4152 Sepsis due to Pseudomonas: Secondary | ICD-10-CM | POA: Diagnosis not present

## 2019-02-18 DIAGNOSIS — C9001 Multiple myeloma in remission: Secondary | ICD-10-CM | POA: Diagnosis not present

## 2019-02-18 DIAGNOSIS — E2740 Unspecified adrenocortical insufficiency: Secondary | ICD-10-CM | POA: Diagnosis present

## 2019-02-18 DIAGNOSIS — K219 Gastro-esophageal reflux disease without esophagitis: Secondary | ICD-10-CM | POA: Diagnosis present

## 2019-02-18 DIAGNOSIS — J9621 Acute and chronic respiratory failure with hypoxia: Secondary | ICD-10-CM | POA: Diagnosis not present

## 2019-02-18 DIAGNOSIS — Z515 Encounter for palliative care: Secondary | ICD-10-CM | POA: Diagnosis not present

## 2019-02-18 DIAGNOSIS — E876 Hypokalemia: Secondary | ICD-10-CM | POA: Diagnosis present

## 2019-02-18 DIAGNOSIS — X58XXXS Exposure to other specified factors, sequela: Secondary | ICD-10-CM | POA: Diagnosis not present

## 2019-02-18 DIAGNOSIS — J9601 Acute respiratory failure with hypoxia: Secondary | ICD-10-CM | POA: Diagnosis not present

## 2019-02-18 DIAGNOSIS — Z930 Tracheostomy status: Secondary | ICD-10-CM | POA: Diagnosis not present

## 2019-02-18 DIAGNOSIS — R1310 Dysphagia, unspecified: Secondary | ICD-10-CM | POA: Diagnosis present

## 2019-02-18 DIAGNOSIS — E118 Type 2 diabetes mellitus with unspecified complications: Secondary | ICD-10-CM | POA: Diagnosis present

## 2019-02-18 DIAGNOSIS — Z9911 Dependence on respirator [ventilator] status: Secondary | ICD-10-CM | POA: Diagnosis not present

## 2019-02-18 DIAGNOSIS — J95851 Ventilator associated pneumonia: Secondary | ICD-10-CM | POA: Diagnosis present

## 2019-02-18 DIAGNOSIS — Z79899 Other long term (current) drug therapy: Secondary | ICD-10-CM | POA: Diagnosis not present

## 2019-02-18 DIAGNOSIS — K7460 Unspecified cirrhosis of liver: Secondary | ICD-10-CM | POA: Diagnosis present

## 2019-02-18 DIAGNOSIS — J9610 Chronic respiratory failure, unspecified whether with hypoxia or hypercapnia: Secondary | ICD-10-CM | POA: Diagnosis not present

## 2019-02-18 DIAGNOSIS — Y95 Nosocomial condition: Secondary | ICD-10-CM | POA: Diagnosis present

## 2019-02-18 DIAGNOSIS — E119 Type 2 diabetes mellitus without complications: Secondary | ICD-10-CM | POA: Diagnosis not present

## 2019-02-18 DIAGNOSIS — R64 Cachexia: Secondary | ICD-10-CM | POA: Diagnosis present

## 2019-02-18 DIAGNOSIS — G931 Anoxic brain damage, not elsewhere classified: Secondary | ICD-10-CM | POA: Diagnosis not present

## 2019-02-18 DIAGNOSIS — T148XXS Other injury of unspecified body region, sequela: Secondary | ICD-10-CM | POA: Diagnosis not present

## 2019-02-18 DIAGNOSIS — I97120 Postprocedural cardiac arrest following cardiac surgery: Secondary | ICD-10-CM | POA: Diagnosis present

## 2019-02-18 DIAGNOSIS — L89626 Pressure-induced deep tissue damage of left heel: Secondary | ICD-10-CM | POA: Diagnosis present

## 2019-02-18 DIAGNOSIS — R8281 Pyuria: Secondary | ICD-10-CM | POA: Diagnosis not present

## 2019-02-18 DIAGNOSIS — S14133S Anterior cord syndrome at C3 level of cervical spinal cord, sequela: Secondary | ICD-10-CM | POA: Diagnosis not present

## 2019-02-18 DIAGNOSIS — I959 Hypotension, unspecified: Secondary | ICD-10-CM | POA: Diagnosis not present

## 2019-02-18 DIAGNOSIS — G8252 Quadriplegia, C1-C4 incomplete: Secondary | ICD-10-CM | POA: Diagnosis not present

## 2019-02-18 DIAGNOSIS — G8250 Quadriplegia, unspecified: Secondary | ICD-10-CM | POA: Diagnosis present

## 2019-02-18 DIAGNOSIS — Z20828 Contact with and (suspected) exposure to other viral communicable diseases: Secondary | ICD-10-CM | POA: Diagnosis present

## 2019-02-18 DIAGNOSIS — I469 Cardiac arrest, cause unspecified: Secondary | ICD-10-CM | POA: Diagnosis not present

## 2019-02-18 DIAGNOSIS — I340 Nonrheumatic mitral (valve) insufficiency: Secondary | ICD-10-CM | POA: Diagnosis not present

## 2019-02-18 LAB — URINALYSIS, ROUTINE W REFLEX MICROSCOPIC
Bilirubin Urine: NEGATIVE
Glucose, UA: NEGATIVE mg/dL
Ketones, ur: NEGATIVE mg/dL
Nitrite: NEGATIVE
Protein, ur: 100 mg/dL — AB
RBC / HPF: >50 RBC/hpf — ABNORMAL HIGH (ref 0–5)
Specific Gravity, Urine: 1.009 (ref 1.005–1.030)
pH: 7 (ref 5.0–8.0)

## 2019-02-18 LAB — CBC WITH DIFFERENTIAL/PLATELET
Abs Immature Granulocytes: 0.04 10*3/uL (ref 0.00–0.07)
Basophils Absolute: 0 10*3/uL (ref 0.0–0.1)
Basophils Relative: 0 %
Eosinophils Absolute: 0.2 10*3/uL (ref 0.0–0.5)
Eosinophils Relative: 3 %
HCT: 24.4 % — ABNORMAL LOW (ref 39.0–52.0)
Hemoglobin: 7.5 g/dL — ABNORMAL LOW (ref 13.0–17.0)
Immature Granulocytes: 1 %
Lymphocytes Relative: 14 %
Lymphs Abs: 1 10*3/uL (ref 0.7–4.0)
MCH: 29.8 pg (ref 26.0–34.0)
MCHC: 30.7 g/dL (ref 30.0–36.0)
MCV: 96.8 fL (ref 80.0–100.0)
Monocytes Absolute: 0.3 10*3/uL (ref 0.1–1.0)
Monocytes Relative: 4 %
Neutro Abs: 5.9 10*3/uL (ref 1.7–7.7)
Neutrophils Relative %: 78 %
Platelets: 168 10*3/uL (ref 150–400)
RBC: 2.52 MIL/uL — ABNORMAL LOW (ref 4.22–5.81)
RDW: 16.9 % — ABNORMAL HIGH (ref 11.5–15.5)
WBC: 7.5 10*3/uL (ref 4.0–10.5)
nRBC: 0.3 % — ABNORMAL HIGH (ref 0.0–0.2)

## 2019-02-18 LAB — POCT I-STAT 7, (LYTES, BLD GAS, ICA,H+H)
Bicarbonate: 25.2 mmol/L (ref 20.0–28.0)
Calcium, Ion: 1.75 mmol/L (ref 1.15–1.40)
HCT: 25 % — ABNORMAL LOW (ref 39.0–52.0)
Hemoglobin: 8.5 g/dL — ABNORMAL LOW (ref 13.0–17.0)
O2 Saturation: 97 %
Patient temperature: 96.9
Potassium: 5 mmol/L (ref 3.5–5.1)
Sodium: 138 mmol/L (ref 135–145)
TCO2: 26 mmol/L (ref 22–32)
pCO2 arterial: 41.8 mmHg (ref 32.0–48.0)
pH, Arterial: 7.384 (ref 7.350–7.450)
pO2, Arterial: 90 mmHg (ref 83.0–108.0)

## 2019-02-18 LAB — LACTIC ACID, PLASMA: Lactic Acid, Venous: 0.7 mmol/L (ref 0.5–1.9)

## 2019-02-18 LAB — COMPREHENSIVE METABOLIC PANEL
ALT: 63 U/L — ABNORMAL HIGH (ref 0–44)
AST: 69 U/L — ABNORMAL HIGH (ref 15–41)
Albumin: 2.1 g/dL — ABNORMAL LOW (ref 3.5–5.0)
Alkaline Phosphatase: 163 U/L — ABNORMAL HIGH (ref 38–126)
Anion gap: 8 (ref 5–15)
BUN: 83 mg/dL — ABNORMAL HIGH (ref 8–23)
CO2: 22 mmol/L (ref 22–32)
Calcium: 11.8 mg/dL — ABNORMAL HIGH (ref 8.9–10.3)
Chloride: 103 mmol/L (ref 98–111)
Creatinine, Ser: 0.76 mg/dL (ref 0.61–1.24)
GFR calc Af Amer: >60 mL/min (ref >60)
GFR calc non Af Amer: >60 mL/min (ref >60)
Glucose, Bld: 87 mg/dL (ref 70–99)
Potassium: 5.4 mmol/L — ABNORMAL HIGH (ref 3.5–5.1)
Sodium: 133 mmol/L — ABNORMAL LOW (ref 135–145)
Total Bilirubin: 0.3 mg/dL (ref 0.3–1.2)
Total Protein: 8.2 g/dL — ABNORMAL HIGH (ref 6.5–8.1)

## 2019-02-18 LAB — HEMOGLOBIN A1C
Hgb A1c MFr Bld: 5.2 % (ref 4.8–5.6)
Mean Plasma Glucose: 102.54 mg/dL

## 2019-02-18 LAB — CORTISOL: Cortisol, Plasma: 13.3 ug/dL

## 2019-02-18 LAB — SARS CORONAVIRUS 2 BY RT PCR (HOSPITAL ORDER, PERFORMED IN ~~LOC~~ HOSPITAL LAB): SARS Coronavirus 2: NEGATIVE

## 2019-02-18 LAB — VANCOMYCIN, RANDOM: Vancomycin Rm: 42

## 2019-02-18 LAB — CBG MONITORING, ED: Glucose-Capillary: 77 mg/dL (ref 70–99)

## 2019-02-18 MED ORDER — SODIUM CHLORIDE 0.9 % IV SOLN: 2.0000 g | Freq: Three times a day (TID) | INTRAVENOUS | Status: DC

## 2019-02-18 MED ORDER — ALBUTEROL (5 MG/ML) CONTINUOUS INHALATION SOLN: 10.0000 mg | INHALATION_SOLUTION | RESPIRATORY_TRACT | Status: DC

## 2019-02-18 MED ORDER — SODIUM POLYSTYRENE SULFONATE 15 GM/60ML PO SUSP
15.0000 g | Freq: Once | ORAL | Status: AC
  Administered 2019-02-19: 15 g via ORAL
  Filled 2019-02-18: qty 60

## 2019-02-18 MED ORDER — PROMETHAZINE HCL 25 MG PO TABS: 12.5000 mg | ORAL_TABLET | Freq: Four times a day (QID) | ORAL | Status: DC | PRN

## 2019-02-18 MED ORDER — FERROUS SULFATE 300 (60 FE) MG/5ML PO SYRP
325.0000 mg | ORAL_SOLUTION | Freq: Every day | ORAL | Status: DC
  Administered 2019-02-19 – 2019-02-21 (×3): 325 mg
  Filled 2019-02-18 (×3): qty 10

## 2019-02-18 MED ORDER — DEXTROSE 50 % IV SOLN
INTRAVENOUS | Status: AC
  Filled 2019-02-18: qty 50

## 2019-02-18 MED ORDER — INSULIN ASPART 100 UNIT/ML ~~LOC~~ SOLN
0.0000 [IU] | Freq: Three times a day (TID) | SUBCUTANEOUS | Status: DC
  Administered 2019-02-19 – 2019-02-20 (×3): 2 [IU] via SUBCUTANEOUS

## 2019-02-18 MED ORDER — DOCUSATE SODIUM 50 MG/5ML PO LIQD
100.0000 mg | Freq: Every day | ORAL | Status: DC
  Administered 2019-02-19 – 2019-02-21 (×3): 100 mg via ORAL
  Filled 2019-02-18 (×3): qty 10

## 2019-02-18 MED ORDER — CHLORHEXIDINE GLUCONATE 0.12% ORAL RINSE (MEDLINE KIT)
15.0000 mL | Freq: Two times a day (BID) | OROMUCOSAL | Status: DC
  Administered 2019-02-19 – 2019-02-21 (×5): 15 mL via OROMUCOSAL

## 2019-02-18 MED ORDER — LACTATED RINGERS IV BOLUS
500.0000 mL | Freq: Once | INTRAVENOUS | Status: AC
  Administered 2019-02-18: 500 mL via INTRAVENOUS

## 2019-02-18 MED ORDER — ORAL CARE MOUTH RINSE
15.0000 mL | OROMUCOSAL | Status: DC
  Administered 2019-02-19 – 2019-02-21 (×29): 15 mL via OROMUCOSAL

## 2019-02-18 MED ORDER — SODIUM CHLORIDE 0.9 % IV SOLN
2.0000 g | Freq: Two times a day (BID) | INTRAVENOUS | Status: DC
  Administered 2019-02-19: 2 g via INTRAVENOUS
  Filled 2019-02-18: qty 2

## 2019-02-18 MED ORDER — PANTOPRAZOLE SODIUM 40 MG PO TBEC: 40.0000 mg | DELAYED_RELEASE_TABLET | ORAL | Status: DC

## 2019-02-18 MED ORDER — ACETAMINOPHEN 325 MG PO TABS: 650.0000 mg | ORAL_TABLET | Freq: Four times a day (QID) | ORAL | Status: DC | PRN

## 2019-02-18 MED ORDER — DEXTROSE 50 % IV SOLN
25.0000 mL | Freq: Once | INTRAVENOUS | Status: AC
  Administered 2019-02-19: 25 mL via INTRAVENOUS

## 2019-02-18 MED ORDER — SODIUM CHLORIDE 0.9 % IV SOLN
INTRAVENOUS | Status: DC
  Administered 2019-02-19: via INTRAVENOUS

## 2019-02-18 MED ORDER — FAMOTIDINE 20 MG PO TABS
20.0000 mg | ORAL_TABLET | Freq: Two times a day (BID) | ORAL | Status: DC
  Administered 2019-02-19 – 2019-02-20 (×3): 20 mg
  Filled 2019-02-18 (×3): qty 1

## 2019-02-18 MED ORDER — CLONAZEPAM 0.5 MG PO TABS
0.5000 mg | ORAL_TABLET | Freq: Two times a day (BID) | ORAL | Status: DC | PRN
  Administered 2019-02-20: 14:00:00 0.5 mg
  Filled 2019-02-18: qty 1

## 2019-02-18 MED ORDER — HEPARIN SODIUM (PORCINE) 5000 UNIT/ML IJ SOLN
5000.0000 [IU] | Freq: Three times a day (TID) | INTRAMUSCULAR | Status: DC
  Administered 2019-02-19 – 2019-02-21 (×8): 5000 [IU] via SUBCUTANEOUS
  Filled 2019-02-18 (×8): qty 1

## 2019-02-18 MED ORDER — PEPTAMEN AF PO LIQD: 60.0000 mL | ORAL | Status: DC

## 2019-02-18 MED ORDER — PANTOPRAZOLE SODIUM 40 MG PO PACK
40.0000 mg | PACK | Freq: Every day | ORAL | Status: DC
  Administered 2019-02-19 – 2019-02-21 (×3): 40 mg
  Filled 2019-02-18 (×2): qty 20

## 2019-02-18 MED ORDER — VANCOMYCIN VARIABLE DOSE PER UNSTABLE RENAL FUNCTION (PHARMACIST DOSING): Status: DC

## 2019-02-18 MED ORDER — LACTATED RINGERS IV BOLUS
1000.0000 mL | Freq: Once | INTRAVENOUS | Status: AC
  Administered 2019-02-18: 1000 mL via INTRAVENOUS

## 2019-02-18 MED ORDER — ALBUTEROL SULFATE (2.5 MG/3ML) 0.083% IN NEBU: 10.0000 mg | INHALATION_SOLUTION | Freq: Once | RESPIRATORY_TRACT | Status: DC

## 2019-02-18 MED ORDER — ENOXAPARIN SODIUM 40 MG/0.4ML ~~LOC~~ SOLN: 40.0000 mg | SUBCUTANEOUS | Status: DC

## 2019-02-18 MED ORDER — SODIUM CHLORIDE 0.9 % IV SOLN
2.0000 g | Freq: Once | INTRAVENOUS | Status: AC
  Administered 2019-02-18: 2 g via INTRAVENOUS
  Filled 2019-02-18: qty 2

## 2019-02-18 MED ORDER — LACTATED RINGERS IV SOLN
INTRAVENOUS | Status: DC
  Administered 2019-02-18: 20:00:00 via INTRAVENOUS

## 2019-02-18 MED ORDER — DOCUSATE SODIUM 100 MG PO CAPS: 100.0000 mg | ORAL_CAPSULE | ORAL | Status: DC

## 2019-02-18 MED ORDER — JUVEN PO PACK
1.0000 | PACK | Freq: Two times a day (BID) | ORAL | Status: DC
  Administered 2019-02-19 – 2019-02-21 (×5): 1
  Filled 2019-02-18 (×6): qty 1

## 2019-02-18 MED ORDER — PRO-STAT SUGAR FREE PO LIQD
30.0000 mL | Freq: Two times a day (BID) | ORAL | Status: DC
  Administered 2019-02-19 (×2): 30 mL
  Filled 2019-02-18: qty 30

## 2019-02-18 MED ORDER — FAMOTIDINE IN NACL 20-0.9 MG/50ML-% IV SOLN: 20.0000 mg | Freq: Two times a day (BID) | INTRAVENOUS | Status: DC

## 2019-02-18 MED ORDER — SENNOSIDES-DOCUSATE SODIUM 8.6-50 MG PO TABS: 1.0000 | ORAL_TABLET | Freq: Every evening | ORAL | Status: DC | PRN

## 2019-02-18 MED ORDER — STERILE WATER FOR INJECTION IV SOLN
100.0000 mL | INTRAVENOUS | Status: DC
  Administered 2019-02-19 (×2): 100 mL
  Filled 2019-02-18 (×5): qty 100

## 2019-02-18 MED ORDER — ACETAMINOPHEN 650 MG RE SUPP: 650.0000 mg | Freq: Four times a day (QID) | RECTAL | Status: DC | PRN

## 2019-02-18 MED ORDER — DIGESTIVE ENZYME PO CAPS
220.0000 mg | ORAL_CAPSULE | Freq: Every day | ORAL | Status: DC | PRN
  Filled 2019-02-18: qty 1

## 2019-02-18 NOTE — H&P (Addendum)
Date: 02/18/2019               Patient Name:  Alexander Scott MRN: [id number]  DOB: 17-Feb-1951 Age / Sex: 68 y.o., male   PCP: Deanne Coffer, MD         Medical Service: Internal Medicine Teaching Service         Attending Physician: Dr. Lucious Groves, DO    First Contact: Dr. Gilford Rile Pager: [phone]  Second Contact: Dr. Tarri Abernethy Pager: [phone]       After Hours (After 5p/  First Contact Pager: [phone]  weekends / holidays): Second Contact Pager: [phone]   Chief Complaint: hypothermia  History of Present Illness: This is a 67 year old male who was transferred from [place] for declining stability. 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 Patient is unable to contribute to history due to history of anoxic brain injury so chart review and records obtained from [facility] serve as primary historians. No family present at bedside. Appears that the above issues occurred following a fall from his porch at home in May 2020. Called [facility] and obtained 2 progress notes and medication list from them.  Meds:  Current Meds  Medication Sig  . Banana Flakes (BANATROL PLUS) PACK Place 1 packet into feeding tube 3 (three) times daily.  . clonazePAM (KLONOPIN) 0.5 MG tablet Place 0.5 mg into feeding tube every 12 (twelve) hours as needed for anxiety.     Allergies: Allergies as of 02/18/2019  . (No Known Allergies)   Past Medical History:  Diagnosis Date  . Acute on chronic respiratory failure with hypoxia ([place])   . Cardiac arrest ([place])   . Cervical spinal cord injury, sequela ([place])   . Pleural effusion associated with hepatic disorder   . Polysubstance abuse ([place])   . S/P placement of  cardiac pacemaker   . Subdural hematoma, post-traumatic ([facility])     Family History: unable to be obtained due to patient's mental status   Social History: unable to be obtained due to patient's mental status  Review of Systems: unable to be obtained due to patient's mental status  Physical Exam: Blood pressure 100/71, pulse 84, temperature (!) 96.9 F (36.1 C), resp. rate 19, height _0  (1.676 m), weight 62.9 kg, SpO2 100 %. Vent settings: PEEP 5; FiO2 40% RR 18  GENERAL: chronically ill appearing. Bear hugger warming device on patient.  HEENT: Tracheostomy in place. Sluggish pupillary response to light. No nasal discharge. Copious oral secretions. Tracheostomy in place. CARDIAC: heart sounds difficult to assess due to mechanical breath sounds. Regular rate. +1 bilateral upper and lower extremity edema extending to ankle. No JVD. Extremities warm. PULMONARY: mechical breath sounds. Expiratory wheezes present. ABDOMEN: distended. Bowel sounds active.  G tube present. Foley present. NEURO: unable to assess due to patient's somnolent state. Opens eyes to slowly to voice however does not follow commands. SKIN: sacral ulcer present with dressing intact. No drainage. Does not have infectious appearance.    PSYCH: does not awaken enough to assess  EKG: personally reviewed my interpretation is paced rhythm   CXR: personally reviewed my interpretation is bibasilar opacities  Assessment & Plan by Problem: Active Problems:   Sepsis [facility]) Summary: This is a 68 year old  male with PMH of spinal cord injury at the level of C3-C4 with consequential chronic respiratory failure requiring ventilatory support via tracheostomy ,anoxic brain injury who was transferred from [facility] for declining stability including hypotension and hypothermia.    Sepsis.  Tmin 90.64F on initial presentation. Tmax 96.42F with bear hugger. Tachycardic with HR increasing since initial presentation.  Hypotensive--SBP min 84, max 122 on presentation with MAPs in high 60s-mid 70s. Opacities on CXR may be source. Patient has received 7d course of vancomycin/meromenem which appears to have been started for a pseudomonas infection. Received vanc and cefepime in ED prior to vanc trough result--42. 02/18/19 CXR: bibasilar opacities concerning for aspiration or pneumonia  8/18 ABG wnl: pH 7.38, pCO2 42, pO2 90, bicarb 25  Impression: Concerning for sepsis. From medical records obtained from [place], it does appear th patient has been treated for the past week with vancomycin and meropenem potentially for a pseudomonas infection. Additionally, his LA and white count are wnl with no left shift. Adrenal crisis also considered however random cortisol wnl on admission. If infectious etiology r/o, may also consider disregulation 2/2 spinal cord injury.  Plan:  -blood, urine and respiratory cultures pending. -repeat CBC in am -will continue cefepime -echo in AM  If VS do not return to baseline and infection is r/o, may consider more extensive workup for adrenal crisis or consequence of spinal cord injury.  Hypercalcemia--corrected calcium 12.8 on admission. Ionized calcium 1.75. Plan: repeat cmp in AM. PTH and phos levels pending.  Hyperkalemia--K 5.4 on admission Plan: kayexalate given DM type II Plan: SSI.  Permanent tracheostomy s/p spinal cord injury. Vent settings while in room: PEEP 5; FiO2 40%. Due to lack of full medical records, unsure of how this compares to his baseline requirements.   Hematuria- UA + for RBC/hemoglobin. Potentially secondary to UTI as some bacteria were present on UA along with leukocytes. No nitrites.  Impression: consider consequence of renal hypoperfusion.  Plan: UC pending  Normocytic chronic anemia. Likely related to chronic disease state.  Plan: ferrous sulfate 345m   Dysphagia Plan: G tube in place. Plan to continue feeds. Nutrition consult placed  Mild  hyponatremia--133 on admission. Continue to monitor  Gamma Gap elevated: 6.1. ddx includes infection, malignancy, or autoimmune inflammatory diseases. I would suspect that this is secondary to infectious process.  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 G Tube feeds. Nutrition consult placed. Glucose control:SSI GI prophylaxis: pepcid DVT for prophylaxis: heparin 5000U subq Micro: BCx2, UC, respiratory cultures pending ABX: cefepime Lines: IV Social considerations: Palliative care consult for goals of care.   Dispo: Admit patient to Inpatient with expected length of stay greater than 2 midnights.  Signed: CMitzi Hansen MD 02/18/2019, 9:00 PM  Pager: _[phone] @

## 2019-02-18 NOTE — ED Provider Notes (Signed)
[facility] EMERGENCY DEPARTMENT Provider Note   CSN: [id number] Arrival date & time: 02/18/19  1612    History   Chief Complaint Chief Complaint  Patient presents with  . [facility] Patient    HPI BUREN HAVEY is a 68 y.o. male.     Level 5 caveat due to chronic respiratory failure, nonverbal at baseline.  Patient brought in by EMS due to hypotension at nursing clinic.  Patient just recently finished a course of IV antibiotics for pneumonia.  Has been needing his vent more often than normal.  Patient with chronic indwelling Foley catheter, Flexi-Seal.  Patient was hypotensive with EMS but has improved with a liter of IV fluids.  Patient otherwise is hypothermic.  Patient has had intermittent hypotension over the last several days.  The history is provided by a caregiver and the EMS personnel.  Illness Location:  General Severity:  Mild Onset quality:  Gradual Timing:  Intermittent Progression:  Waxing and waning Chronicity:  Recurrent   Past Medical History:  Diagnosis Date  . Acute on chronic respiratory failure with hypoxia ([place])   . Cardiac arrest ([place])   . Cervical spinal cord injury, sequela ([place])   . Pleural effusion associated with hepatic disorder   . Polysubstance abuse ([place])   . S/P placement of cardiac pacemaker   . Subdural hematoma, post-traumatic [facility])     Patient Active Problem List   Diagnosis Date Noted  . Sepsis ([place]) 02/18/2019  . Cardiac arrest ([place])   . S/P placement of cardiac pacemaker   . Acute on chronic respiratory failure with hypoxia ([place])   . Cervical spinal cord injury, sequela ([place])   . 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 Polysubstance abuse ([place])   . Pleural effusion associated with hepatic disorder     History reviewed. No pertinent surgical history.      Home Medications    Prior to Admission medications   Medication Sig Start Date End Date Taking? Authorizing Provider  Banana Flakes (BANATROL  PLUS) PACK Place 1 packet into feeding tube 3 (three) times daily.   Yes [provider]  clonazePAM (KLONOPIN) 0.5 MG tablet Place 0.5 mg into feeding tube every 12 (twelve) hours as needed for anxiety.   Yes [provider]    Family History History reviewed. No pertinent family history.  Social History Social History   Tobacco Use  . Smoking status: Not on file  Substance Use Topics  . Alcohol use: Not on file  . Drug use: Not on file     Allergies   Patient has no known allergies.   Review of Systems Review of Systems  Unable to perform ROS: Patient nonverbal     Physical Exam Updated Vital Signs  ED Triage Vitals  Enc Vitals Group     BP 02/18/19 1617 115/82     Pulse Rate 02/18/19 1617 70     Resp 02/18/19 1617 18     Temp 02/18/19 1630 (!) 90.5 F (32.5 C)     Temp Source 02/18/19 1630 Rectal     SpO2 02/18/19 1617 100 %     Weight 02/18/19 1624 138 lb 9.6 oz (62.9 kg)     Height --      Head Circumference --      Peak Flow --      Pain Score --      Pain Loc --      Pain Edu? --  Excl. in [place]? --     Physical Exam Vitals signs and nursing note reviewed.  Constitutional:      General: He is not in acute distress.    Appearance: He is well-developed. He is ill-appearing (chronic).  HENT:     Head: Normocephalic and atraumatic.     Mouth/Throat:     Mouth: Mucous membranes are dry.  Eyes:     Conjunctiva/sclera: Conjunctivae normal.     Pupils: Pupils are equal, round, and reactive to light.  Neck:     Musculoskeletal: Neck supple.  Cardiovascular:     Rate and Rhythm: Normal rate and regular rhythm.     Pulses: Normal pulses.     Heart sounds: Normal heart sounds. No murmur.  Pulmonary:     Effort: No respiratory distress.     Comments: Coarse breath sounds throughout, no major secretions Abdominal:     Palpations: Abdomen is soft.     Tenderness: There is no abdominal tenderness.  Musculoskeletal:     Right lower  leg: No edema.     Left lower leg: No edema.  Skin:    Capillary Refill: Capillary refill takes less than 2 seconds.     Comments: Chronic sacral ulcer overall well-appearing  Neurological:     General: No focal deficit present.     Mental Status: He is alert.     Comments: Eyes open spontaneously      ED Treatments / Results  Labs (all labs ordered are listed, but only abnormal results are displayed) Labs Reviewed  COMPREHENSIVE METABOLIC PANEL - Abnormal; Notable for the following components:      Result Value   Sodium 133 (*)    Potassium 5.4 (*)    BUN 83 (*)    Calcium 11.8 (*)    Total Protein 8.2 (*)    Albumin 2.1 (*)    AST 69 (*)    ALT 63 (*)    Alkaline Phosphatase 163 (*)    All other components within normal limits  CBC WITH DIFFERENTIAL/PLATELET - Abnormal; Notable for the following components:   RBC 2.52 (*)    Hemoglobin 7.5 (*)    HCT 24.4 (*)    RDW 16.9 (*)    nRBC 0.3 (*)    All other components within normal limits  URINALYSIS, ROUTINE W REFLEX MICROSCOPIC - Abnormal; Notable for the following components:   Color, Urine AMBER (*)    APPearance HAZY (*)    Hgb urine dipstick LARGE (*)    Protein, ur 100 (*)    Leukocytes,Ua MODERATE (*)    RBC / HPF >50 (*)    Bacteria, UA RARE (*)    All other components within normal limits  SARS CORONAVIRUS 2 (HOSPITAL ORDER, [place] [facility])  CULTURE, BLOOD (ROUTINE X 2)  CULTURE, BLOOD (ROUTINE X 2)  URINE CULTURE  CULTURE, RESPIRATORY  LACTIC ACID, PLASMA  VANCOMYCIN, RANDOM  CORTISOL  BLOOD GAS, ARTERIAL  HIV ANTIBODY (ROUTINE TESTING W REFLEX)  COMPREHENSIVE METABOLIC PANEL  CBC  CBG MONITORING, ED    EKG EKG Interpretation  Date/Time:  Tuesday February 18 2019 16:57:12 EDT Ventricular Rate:  70 PR Interval:    QRS Duration: 101 QT Interval:  374 QTC Calculation: 404 R Axis:   39 Text Interpretation:  Atrial-ventricular dual-paced rhythm No further analysis  attempted due to paced rhythm Artifact in lead(s) I II aVR aVL aVF V1 V2 Confirmed by Lennice Sites [phone]) on 02/18/2019 6:53:21 PM  Radiology Dg Chest Port 1 View  Result Date: 02/18/2019 CLINICAL DATA:  Hypotension EXAM: PORTABLE CHEST 1 VIEW COMPARISON:  None. FINDINGS: Tracheostomy tube appears positioned at the level of the clavicular heads. Left-sided implanted cardiac device. Heart size appears mildly enlarged. Streaky airspace opacities in the medial aspect of the right lower lobe and left lower lobes. No pleural effusion. No pneumothorax. IMPRESSION: Airspace opacities in the bilateral lung bases concerning for aspiration and/or pneumonia. Electronically Signed   By: Davina Poke M.D.   On: 02/18/2019 17:05    Procedures .Critical Care Performed by: Lennice Sites, DO Authorized by: Lennice Sites, DO   Critical care provider statement:    Critical care time (minutes):  27   Critical care was necessary to treat or prevent imminent or life-threatening deterioration of the following conditions:  Sepsis and respiratory failure   Critical care was time spent personally by me on the following activities:  Blood draw for specimens, development of treatment plan with patient or surrogate, discussions with consultants, discussions with primary provider, evaluation of patient's response to treatment, examination of patient, obtaining history from patient or surrogate, ordering and performing treatments and interventions, ordering and review of laboratory studies, ordering and review of radiographic studies, pulse oximetry, re-evaluation of patient's condition and review of old charts   I assumed direction of critical care for this patient from another provider in my specialty: no     (including critical care time)  Medications Ordered in ED Medications  ceFEPIme (MAXIPIME) 2 g in sodium chloride 0.9 % 100 mL IVPB (has no administration in time range)  lactated ringers infusion (  Intravenous New Bag/Given 02/18/19 2028)  chlorhexidine gluconate (MEDLINE KIT) (PERIDEX) 0.12 % solution 15 mL (has no administration in time range)  MEDLINE mouth rinse (has no administration in time range)  famotidine (PEPCID) IVPB 20 mg premix (has no administration in time range)  enoxaparin (LOVENOX) injection 40 mg (has no administration in time range)  acetaminophen (TYLENOL) tablet 650 mg (has no administration in time range)    Or  acetaminophen (TYLENOL) suppository 650 mg (has no administration in time range)  senna-docusate (Senokot-S) tablet 1 tablet (has no administration in time range)  promethazine (PHENERGAN) tablet 12.5 mg (has no administration in time range)  lactated ringers bolus 1,000 mL (has no administration in time range)  lactated ringers bolus 1,000 mL (0 mLs Intravenous Stopped 02/18/19 1936)  ceFEPIme (MAXIPIME) 2 g in sodium chloride 0.9 % 100 mL IVPB (0 g Intravenous Stopped 02/18/19 1925)  lactated ringers bolus 500 mL (500 mLs Intravenous New Bag/Given 02/18/19 1956)     Initial Impression / Assessment and Plan / ED Course  I have reviewed the triage vital signs and the nursing notes.  Pertinent labs & imaging results that were available during my care of the patient were reviewed by me and considered in my medical decision making (see chart for details).       DANY WALTHER is a 68 year old male with history of spinal cord injury status post trach and PEG, chronic respiratory failure who presents to the ED with hypotension and concern for sepsis.  Patient hypotensive with EMS but has improved with 1 L fluid.  Multiple sources for infection including ongoing ventilator associated pneumonia versus UTI.  Sacral wound looks stable.  Talked with nursing home as patient is nonverbal at baseline.  Patient is DNR.  Patient finished a course IV vancomycin and IV meropenem for ventilator associated pneumonia.  They state that  his chronic wounds have actually been  improving.  Chest x-ray today concerning for ongoing pneumonia.  Patient does have some increased secretions.  Urinalysis also concerning for infection.  However patient with no leukocytosis.  Normal lactic acid.  Lab work overall unremarkable otherwise.  Patient given additional 2 L of IV fluid.  Patient given IV cefepime.  Will check vancomycin level.  Will admit to medicine for further care.  ICU has been consulted as well as patient has had continued low blood pressure but does not need IV pressors at this time.  They will manage the patient's ventilator.  Patient admitted in stable condition.  This chart was dictated using voice recognition software.  Despite best efforts to proofread,  errors can occur which can change the documentation meaning.    Final Clinical Impressions(s) / ED Diagnoses   Final diagnoses:  Respiratory failure ([place])  VAP (ventilator-associated pneumonia) ([place])  Urinary tract infection without hematuria, site unspecified    ED Discharge Orders    None       Lennice Sites, DO 02/18/19 2049

## 2019-02-18 NOTE — ED Notes (Signed)
Bair hugger applied.

## 2019-02-18 NOTE — ED Triage Notes (Signed)
Pt arrives to ED from [facility] has a trached patient. Per [facility] staff patient was sent to the ED for hypotension of 90/50. [facility] staff gave 1L of fluids and stopped the patients lasix and called EMS.

## 2019-02-18 NOTE — Progress Notes (Signed)
Patient transported from ED Room 15 to 4Y65 with no complications.

## 2019-02-18 NOTE — Progress Notes (Addendum)
Addendum:  Vancomycin random level elevated at 42 - ~35 hours since last dose of Vancomycin 1g IV q24 per [facility] was at [facility] on 8/17. Patient's SCr is likely falsely low as stated prior due to low muscle mass. Patient does not appear to be clearing drug.  *Discussed with Dr. Darrick Meigs (IM) - ok to discontinue Vancomycin at this time.   Plan: Discontinue Vancomycin.  Will also conservatively adjust Cefepime to 2g IV every 12 hours due to difficultly truly assessing renal function and known poor clearance.   Sloan Leiter, PharmD, BCPS, BCCCP Clinical Pharmacist Please refer to [facility] for [place] numbers 02/18/2019, 8:59 PM    Pharmacy Antibiotic Note  Alexander Scott is a 68 y.o. male admitted on 02/18/2019 from [facility] with hypotension concerning for sepsis.  Pharmacy has been consulted for Vancomycin and Cefepime dosing.  Per [facility], patient completed a 7 day course of Vancomycin and Merrem. Last dose of Vancomycin 1g IV every 24 hours was on 8/17 at 9AM and last dose of Merrem 1g IV every 12 hours on 8/17 at 1PM. Patient has a trach s/p cervical injury and is on the ventilator. Patient is currently hypothermic (unsure if able to regulate temperature well at baseline) and BP is soft with current MAP at 78. WBC is within normal limits. Lactic acid is normal. SCr is stable at 0.76 (0.7 at [facility]; likely falsely low due to ) with CrCl ~80 mL/min (using SCr 0.8). 8/18 CXR - bilateral opacities concerning for aspiration and/or pneumonia. Discussed with Dr. Ronnald Nian- continue with current antibiotics for now. The laboratory at [place] is closed for today so unsure of prior culture results.   Plan: Cefepime 2g IV x1 now, then 2g IV every 8 hours.  Will check a random Vancomycin level to ensure ok to give further Vancomycin.  Follow-up with [facility] in AM to determine if any prior positive cultures.  Monitor renal function, culture results, and clinical status.   Weight:  138 lb 9.6 oz (62.9 kg)  Temp (24hrs), Avg:90.8 F (32.7 C), Min:89.2 F (31.8 C), Max:92.1 F (33.4 C)  Recent Labs  Lab 02/18/19 1636 02/18/19 1645  WBC 7.5  --   CREATININE 0.76  --   LATICACIDVEN  --  0.7    CrCl cannot be calculated (Unknown ideal weight.).    Not on File  Antimicrobials this admission: Vancomycin 8/18 >>  Cefepime 8/18 >>  Dose adjustments this admission:   Microbiology results: 8/18 BCx >> 8/18 UCx >> 8/18 COVID >>  Thank you for allowing pharmacy to be a part of this patient's care.  Sloan Leiter, PharmD, BCPS, BCCCP Clinical Pharmacist Please refer to [facility] for [place] numbers 02/18/2019 6:27 PM

## 2019-02-18 NOTE — Consult Note (Signed)
NAME:  Alexander Scott, MRN:  [id number], DOB:  01-25-1951, LOS: 0 ADMISSION DATE:  02/18/2019, CONSULTATION DATE:  02/18/2019 REFERRING MD:  Ronnald Nian, CHIEF COMPLAINT:  Vent management   Brief History   64 yoM [facility] resident with chronic respiratory failure with VDRF, trach and peg with recent treatment for PNA sent for ongoing hypotension.  Patient has been fluid responsive thus far and currently hemodynamically stable and to be admitted to [facility].  PCCM consult for vent/ trach management.   History of present illness   HPI obtained from medical chart review as patient is nonverbal at baseline and chronic trach/ vent.  Limited records sent from [place].   68 year old male with history of chronic respiratory failure with tracheostomy/ VDRF, cervical spinal cord injury with paraplegia, cardiac arrest s/p pacemaker, traumatic SDH, poly substance abuse, GERD and chronic sacral ulcer presenting sent from [facility] for hypotension.   Patient has chronic trach, PEG, and foley with rectal tube from facility.  Given limited records sent, unclear if patient requires mechanical ventilation continuously or just at night.  Recently completed 7 day course of merrem and vancomycin for pneumonia on 8/17.  [facility] held his lasix and gave him a 1L bolus.   In ER, patient found to be initially hypothermic at 90.5 with normal heart rate and blood pressure.  Labs noted for normal lactic acid, Na 133, K 5.4, BUN 83, sCr 0.76, akl phos 163, AST 69, ALT 63, albumin 2.1, WBC 7.5, Hgb 7.5, Hct 24.4, platelets 168, ionized calcium 1.75, cortisol 13.3, COVID neg, UA noted for moderate leukocytes with WBC 11-20, CXR with bibasilar opacities.  Patient placed on bair hugger with improvement in temperature.  Blood pressure trending softer but responsive to additional IV fluids.  Cultures sent and empirically started on vancomycin and cefepime.  Foley changed in ER.  ABG on current vent setting of PRVC 18, TV 510, 5 PEEP, and  FiO2 40% was 7.384/ 41.8/ 90.  PCCM called for possible admission however patient is currently hemodynamically stable, responding to IV fluids and presents with state DNR form from facility.  PCCM therefore consulting for ventilator management only.   Past Medical History  Chronic respiratory failure with tracheostomy/ VDRF, cervical spinal cord injury with paraplegia, cardiac arrest s/p pacemaker, anoxic brain injury, traumatic SDH, poly substance abuse, chronic sacral ulcer, liver cirrhosis  Patient is a DNR- presents with state DNR from from facility. Significant Hospital Events   8/18 Admit [facility]/ PCCM for vent management  Consults:   Procedures:  PTA trach >> PTA RUE SL PICC > currently not flushing  Significant Diagnostic Tests:    Micro Data:  - f/u [facility] cultures  8/18 SARS Cov2 >> neg 8/18 BC x 2 >> 8/18 UC >> 8/18 trach asp >>  Antimicrobials:  S/p 7 day completion of vanc and merrem (completed 8/17)   8/18 cefepime >> 8/18 vanc >>  Interim history/subjective:   Objective   Blood pressure 97/75, pulse 84, temperature 99.1 F (37.3 C), resp. rate 20, height 5\' 6"  (1.676 m), weight 62.9 kg, SpO2 100 %.    Vent Mode: PRVC FiO2 (%):  [40 %-50 %] 40 % Set Rate:  [18 bmp] 18 bmp Vt Set:  [510 mL-600 mL] 510 mL PEEP:  [5 cmH20] 5 cmH20 Plateau Pressure:  [25 cmH20] 25 cmH20  No intake or output data in the 24 hours ending 02/18/19 2155 Filed Weights   02/18/19 1624  Weight: 62.9 kg   Examination: General:  Chronically ill appearing male lying on stretcher in NAD with bair hugger in place  HEENT: MM pink/moist- copious oral secretions, pupils 3/reactive, anicteric, midline 7 cuffed portex trach Neuro: will open eyes but not f/c, no movement in extremities CV: rr, no murmur PULM:  MV supported breaths, coarse bs throughout with exp wheeze, thick tan secretions GI: soft, +bs, Gtube clamped, foley in place  Extremities: warm/dry, generalized edema, LUE  contracted- appears left hand is larger compared to right hand, diffuse atrophy, RUE SL PICC- clamped, no cap  Skin: no rashes, posterior pressure wounds not visualized   Resolved Hospital Problem list    Assessment & Plan:   Chronic respiratory failure with chronic tracheostomy  P:  Full MV support, PRVC 8 cc/kg, rate 18 Wean supplemental O2 for goal sat > 92% Prn duonebs  ABG reassuring  Ongoing trach care Pulmonary hygiene Can assess SBT daily, unclear if VDRF, but given high level of c-spine injury would anticipate needing some chronic form of support   Bilateral lower lobe opacities/ HCAP UTI Hypotension P:  Remains fluid responsive with MAP>65 Lactate reassuring 0.7 DNR, therefore vasopressor support will not be offered  Will need to f/u culture data from [facility] Pan-cultured here, follow Trend temp curve/ WBC  CXR in am  Continue empiric vancomycin and cefepime for now    Remainder per primary team.  PCCM will continue to follow for trach / vent management.  Best practice:  Diet: NPO Pain/Anxiety/Delirium protocol (if indicated): if needed VAP protocol (if indicated): yes DVT prophylaxis: SCDs, heparin SQ GI prophylaxis: pepcid per tube Glucose control: trend on BMP Mobility: BR Code Status: DNR, presented with state DNR form from facility Family Communication:  Disposition: PCU, ICU for chronic mechanical ventilation   Labs   CBC: Recent Labs  Lab 02/18/19 1636 02/18/19 2101  WBC 7.5  --   NEUTROABS 5.9  --   HGB 7.5* 8.5*  HCT 24.4* 25.0*  MCV 96.8  --   PLT 168  --     Basic Metabolic Panel: Recent Labs  Lab 02/18/19 1636 02/18/19 2101  NA 133* 138  K 5.4* 5.0  CL 103  --   CO2 22  --   GLUCOSE 87  --   BUN 83*  --   CREATININE 0.76  --   CALCIUM 11.8*  --    GFR: Estimated Creatinine Clearance: 80.8 mL/min (by C-G formula based on SCr of 0.76 mg/dL). Recent Labs  Lab 02/18/19 1636 02/18/19 1645  WBC 7.5  --   LATICACIDVEN  --   0.7    Liver Function Tests: Recent Labs  Lab 02/18/19 1636  AST 69*  ALT 63*  ALKPHOS 163*  BILITOT 0.3  PROT 8.2*  ALBUMIN 2.1*   No results for input(s): LIPASE, AMYLASE in the last 168 hours. No results for input(s): AMMONIA in the last 168 hours.  ABG    Component Value Date/Time   PHART 7.384 02/18/2019 2101   PCO2ART 41.8 02/18/2019 2101   PO2ART 90.0 02/18/2019 2101   HCO3 25.2 02/18/2019 2101   TCO2 26 02/18/2019 2101   O2SAT 97.0 02/18/2019 2101     Coagulation Profile: No results for input(s): INR, PROTIME in the last 168 hours.  Cardiac Enzymes: No results for input(s): CKTOTAL, CKMB, CKMBINDEX, TROPONINI in the last 168 hours.  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   Surgical History   History reviewed. No pertinent surgical history.   Social History   Unable  Family History   His family history is not on file.   Allergies No Known Allergies   Home Medications  Prior to Admission medications   Not on File         Kennieth Rad, MSN, AGACNP-BC [facility] Pgr: [phone] or if no answer [phone] 02/18/2019, 10:21 PM

## 2019-02-19 ENCOUNTER — Inpatient Hospital Stay (HOSPITAL_COMMUNITY): Payer: Medicare Other

## 2019-02-19 DIAGNOSIS — J9610 Chronic respiratory failure, unspecified whether with hypoxia or hypercapnia: Secondary | ICD-10-CM

## 2019-02-19 DIAGNOSIS — E875 Hyperkalemia: Secondary | ICD-10-CM

## 2019-02-19 DIAGNOSIS — L89159 Pressure ulcer of sacral region, unspecified stage: Secondary | ICD-10-CM

## 2019-02-19 DIAGNOSIS — I361 Nonrheumatic tricuspid (valve) insufficiency: Secondary | ICD-10-CM

## 2019-02-19 DIAGNOSIS — R1310 Dysphagia, unspecified: Secondary | ICD-10-CM

## 2019-02-19 DIAGNOSIS — D649 Anemia, unspecified: Secondary | ICD-10-CM

## 2019-02-19 DIAGNOSIS — Z931 Gastrostomy status: Secondary | ICD-10-CM

## 2019-02-19 DIAGNOSIS — Z930 Tracheostomy status: Secondary | ICD-10-CM

## 2019-02-19 DIAGNOSIS — W19XXXS Unspecified fall, sequela: Secondary | ICD-10-CM

## 2019-02-19 DIAGNOSIS — Z7189 Other specified counseling: Secondary | ICD-10-CM

## 2019-02-19 DIAGNOSIS — Z960 Presence of urogenital implants: Secondary | ICD-10-CM

## 2019-02-19 DIAGNOSIS — K7460 Unspecified cirrhosis of liver: Secondary | ICD-10-CM

## 2019-02-19 DIAGNOSIS — Z8674 Personal history of sudden cardiac arrest: Secondary | ICD-10-CM

## 2019-02-19 DIAGNOSIS — E119 Type 2 diabetes mellitus without complications: Secondary | ICD-10-CM

## 2019-02-19 DIAGNOSIS — Z981 Arthrodesis status: Secondary | ICD-10-CM

## 2019-02-19 DIAGNOSIS — N390 Urinary tract infection, site not specified: Secondary | ICD-10-CM

## 2019-02-19 DIAGNOSIS — J95851 Ventilator associated pneumonia: Secondary | ICD-10-CM

## 2019-02-19 DIAGNOSIS — F1490 Cocaine use, unspecified, uncomplicated: Secondary | ICD-10-CM

## 2019-02-19 DIAGNOSIS — E2740 Unspecified adrenocortical insufficiency: Secondary | ICD-10-CM

## 2019-02-19 DIAGNOSIS — E871 Hypo-osmolality and hyponatremia: Secondary | ICD-10-CM

## 2019-02-19 DIAGNOSIS — L89153 Pressure ulcer of sacral region, stage 3: Secondary | ICD-10-CM | POA: Diagnosis present

## 2019-02-19 DIAGNOSIS — R319 Hematuria, unspecified: Secondary | ICD-10-CM

## 2019-02-19 DIAGNOSIS — I340 Nonrheumatic mitral (valve) insufficiency: Secondary | ICD-10-CM

## 2019-02-19 DIAGNOSIS — G931 Anoxic brain damage, not elsewhere classified: Secondary | ICD-10-CM

## 2019-02-19 DIAGNOSIS — F1010 Alcohol abuse, uncomplicated: Secondary | ICD-10-CM

## 2019-02-19 DIAGNOSIS — I959 Hypotension, unspecified: Secondary | ICD-10-CM

## 2019-02-19 DIAGNOSIS — A419 Sepsis, unspecified organism: Principal | ICD-10-CM

## 2019-02-19 DIAGNOSIS — T148XXS Other injury of unspecified body region, sequela: Secondary | ICD-10-CM

## 2019-02-19 DIAGNOSIS — Z515 Encounter for palliative care: Secondary | ICD-10-CM

## 2019-02-19 LAB — COMPREHENSIVE METABOLIC PANEL
ALT: 57 U/L — ABNORMAL HIGH (ref 0–44)
AST: 57 U/L — ABNORMAL HIGH (ref 15–41)
Albumin: 1.9 g/dL — ABNORMAL LOW (ref 3.5–5.0)
Alkaline Phosphatase: 148 U/L — ABNORMAL HIGH (ref 38–126)
Anion gap: 6 (ref 5–15)
BUN: 74 mg/dL — ABNORMAL HIGH (ref 8–23)
CO2: 22 mmol/L (ref 22–32)
Calcium: 11.1 mg/dL — ABNORMAL HIGH (ref 8.9–10.3)
Chloride: 109 mmol/L (ref 98–111)
Creatinine, Ser: 0.7 mg/dL (ref 0.61–1.24)
GFR calc Af Amer: >60 mL/min (ref >60)
GFR calc non Af Amer: >60 mL/min (ref >60)
Glucose, Bld: 62 mg/dL — ABNORMAL LOW (ref 70–99)
Potassium: 5.1 mmol/L (ref 3.5–5.1)
Sodium: 137 mmol/L (ref 135–145)
Total Bilirubin: 0.6 mg/dL (ref 0.3–1.2)
Total Protein: 7.6 g/dL (ref 6.5–8.1)

## 2019-02-19 LAB — CBC
HCT: 19.7 % — ABNORMAL LOW (ref 39.0–52.0)
Hemoglobin: 6.4 g/dL — CL (ref 13.0–17.0)
MCH: 29.5 pg (ref 26.0–34.0)
MCHC: 32.5 g/dL (ref 30.0–36.0)
MCV: 90.8 fL (ref 80.0–100.0)
Platelets: 171 10*3/uL (ref 150–400)
RBC: 2.17 MIL/uL — ABNORMAL LOW (ref 4.22–5.81)
RDW: 16.8 % — ABNORMAL HIGH (ref 11.5–15.5)
WBC: 16 10*3/uL — ABNORMAL HIGH (ref 4.0–10.5)
nRBC: 0.2 % (ref 0.0–0.2)

## 2019-02-19 LAB — HIV ANTIBODY (ROUTINE TESTING W REFLEX): HIV Screen 4th Generation wRfx: NONREACTIVE

## 2019-02-19 LAB — GLUCOSE, CAPILLARY
Glucose-Capillary: 122 mg/dL — ABNORMAL HIGH (ref 70–99)
Glucose-Capillary: 125 mg/dL — ABNORMAL HIGH (ref 70–99)
Glucose-Capillary: 126 mg/dL — ABNORMAL HIGH (ref 70–99)
Glucose-Capillary: 61 mg/dL — ABNORMAL LOW (ref 70–99)
Glucose-Capillary: 66 mg/dL — ABNORMAL LOW (ref 70–99)
Glucose-Capillary: 77 mg/dL (ref 70–99)
Glucose-Capillary: 85 mg/dL (ref 70–99)
Glucose-Capillary: 98 mg/dL (ref 70–99)

## 2019-02-19 LAB — MAGNESIUM
Magnesium: 1.4 mg/dL — ABNORMAL LOW (ref 1.7–2.4)
Magnesium: 1.4 mg/dL — ABNORMAL LOW (ref 1.7–2.4)

## 2019-02-19 LAB — PHOSPHORUS
Phosphorus: 3.6 mg/dL (ref 2.5–4.6)
Phosphorus: 4.3 mg/dL (ref 2.5–4.6)
Phosphorus: 4.2 mg/dL (ref 2.5–4.6)

## 2019-02-19 LAB — HEMOGLOBIN AND HEMATOCRIT, BLOOD
HCT: 26.9 % — ABNORMAL LOW (ref 39.0–52.0)
Hemoglobin: 8.6 g/dL — ABNORMAL LOW (ref 13.0–17.0)

## 2019-02-19 LAB — URINE CULTURE: Culture: NO GROWTH

## 2019-02-19 LAB — ABO/RH: ABO/RH(D): A POS

## 2019-02-19 LAB — ECHOCARDIOGRAM COMPLETE
Height: 71 in
Weight: 2342.17 oz

## 2019-02-19 LAB — PREPARE RBC (CROSSMATCH)

## 2019-02-19 LAB — MRSA PCR SCREENING: MRSA by PCR: NEGATIVE

## 2019-02-19 MED ORDER — SODIUM CHLORIDE 0.9 % IV BOLUS: 1000.0000 mL | Freq: Once | INTRAVENOUS | Status: DC

## 2019-02-19 MED ORDER — DEXTROSE 50 % IV SOLN
25.0000 mL | Freq: Once | INTRAVENOUS | Status: AC
  Administered 2019-02-19: 25 mL via INTRAVENOUS

## 2019-02-19 MED ORDER — SODIUM CHLORIDE 0.9% FLUSH: 10.0000 mL | INTRAVENOUS | Status: DC | PRN

## 2019-02-19 MED ORDER — STERILE WATER FOR IRRIGATION IR SOLN
Status: DC
  Administered 2019-02-19: 100 mL
  Administered 2019-02-19 (×2)
  Administered 2019-02-19: 100 mL
  Administered 2019-02-19: 17:00:00
  Administered 2019-02-20 (×2): 100 mL
  Administered 2019-02-20 (×2)
  Administered 2019-02-20: 100 mL
  Administered 2019-02-20 – 2019-02-21 (×2)
  Administered 2019-02-21: 100 mL
  Administered 2019-02-21 (×2)

## 2019-02-19 MED ORDER — SODIUM CHLORIDE 0.9% FLUSH
10.0000 mL | Freq: Two times a day (BID) | INTRAVENOUS | Status: DC
  Administered 2019-02-19 – 2019-02-21 (×6): 10 mL

## 2019-02-19 MED ORDER — DEXTROSE 5 % IV SOLN
INTRAVENOUS | Status: DC
  Administered 2019-02-19: 05:00:00 via INTRAVENOUS

## 2019-02-19 MED ORDER — HYDROCORTISONE NA SUCCINATE PF 100 MG IJ SOLR
50.0000 mg | Freq: Four times a day (QID) | INTRAMUSCULAR | Status: DC
  Administered 2019-02-19 – 2019-02-20 (×6): 50 mg via INTRAVENOUS
  Filled 2019-02-19 (×6): qty 2

## 2019-02-19 MED ORDER — PRO-STAT SUGAR FREE PO LIQD
30.0000 mL | Freq: Every day | ORAL | Status: DC
  Administered 2019-02-20 – 2019-02-21 (×2): 30 mL
  Filled 2019-02-19 (×2): qty 30

## 2019-02-19 MED ORDER — SODIUM CHLORIDE 0.9% IV SOLUTION
Freq: Once | INTRAVENOUS | Status: AC
  Administered 2019-02-19 (×2): via INTRAVENOUS

## 2019-02-19 MED ORDER — VITAL AF 1.2 CAL PO LIQD
1000.0000 mL | ORAL | Status: DC
  Administered 2019-02-19 – 2019-02-21 (×3): 1000 mL

## 2019-02-19 MED ORDER — SODIUM CHLORIDE 0.9 % IV BOLUS
1000.0000 mL | Freq: Once | INTRAVENOUS | Status: AC
  Administered 2019-02-19: 1000 mL via INTRAVENOUS

## 2019-02-19 MED ORDER — CIPROFLOXACIN IN D5W 400 MG/200ML IV SOLN
400.0000 mg | Freq: Two times a day (BID) | INTRAVENOUS | Status: DC
  Administered 2019-02-19 – 2019-02-21 (×5): 400 mg via INTRAVENOUS
  Filled 2019-02-19 (×5): qty 200

## 2019-02-19 MED ORDER — MAGNESIUM SULFATE 4 GM/100ML IV SOLN
4.0000 g | Freq: Once | INTRAVENOUS | Status: AC
  Administered 2019-02-19: 4 g via INTRAVENOUS
  Filled 2019-02-19: qty 100

## 2019-02-19 NOTE — Consult Note (Signed)
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 Small amount of serous exudate. 1cm x 1cm  with no depth, deep tissue pressure injury: purple area flush with skin. Wound bed: As described above and per photo Drainage (amount, consistency, odor) As described above Periwound: intact, dry Dressing procedure/placement/frequency: Patient is on a mattress relplacement with low air loss feature while in ICU.  Should he transfer to the floor, I will provide a mattress replacement with similar capabilities for low air loss. Patient is being turned from side to side and heels are floated using pillows.  Despite this, biilateral pressure redistribution heel boots are provided today.  Guidance for the care of the sacral Stage 3 PI and the left heel DTPI are provided.  [facility] nursing team will not follow, but will remain available to this patient, the nursing and medical teams.  Please re-consult if needed. Thanks, Maudie Flakes, MSN, RN, [place], Arther Abbott  Pager# [phone]

## 2019-02-19 NOTE — Progress Notes (Signed)
CRITICAL VALUE ALERT  Critical Value:  Hb 6.4  Date & Time Notified: 02/19/2019 0247  Provider Notified: Dr. Darrick Meigs  Orders Received/Actions taken: Awaiting new order

## 2019-02-19 NOTE — Progress Notes (Signed)
Paged IM, Dr. Maricela Bo for soft BP's per [facility] CCM, Dr. Emmit Alexanders.

## 2019-02-19 NOTE — Progress Notes (Signed)
  Echocardiogram 2D Echocardiogram has been performed.  Savyon Loken L Androw 02/19/2019, 11:58 AM

## 2019-02-19 NOTE — Progress Notes (Signed)
 Initial Nutrition Assessment  DOCUMENTATION CODES:   Not applicable  INTERVENTION:   Tube Feeding:  Vital AF 1.2 at 65 ml/hr Pro-Stat 30 mL daily Provides 2072 kcals, 132 g of protein and 1264 mL of free water   Continue Juven BID, each packet provides 80 calories, 8 grams of carbohydrate, 2.5  grams of protein (collagen), 7 grams of L-arginine and 7 grams of L-glutamine; supplement contains CaHMB, Vitamins C, E, B12 and Zinc to promote wound healing     NUTRITION DIAGNOSIS:   Increased nutrient needs related to acute illness, chronic illness, wound healing as evidenced by estimated needs.  GOAL:   Patient will meet greater than or equal to 90% of their needs  MONITOR:   TF tolerance, Vent status, Skin, Weight trends, Labs  REASON FOR ASSESSMENT:   Ventilator    ASSESSMENT:   68 yo male admitted with hypotension with sepsis. PMH includes VDRF with trach and G-tube, cervical spine injury with paraplegia, cardiac arrest, traumatic SDH, GERD, chronic sacral ulcer  Pt on vent support via trach, not currently requiring pressor support  Unable to obtain diet and weight history from pt.  No previous weight encounters Admission wt 62.9 kg; current wt 66.4 kg. Net +1.9 L per I/O flow sheet. No edema. Unsure of true dry wt at this time  Per home med list, pt on Peptamen AF 60 ml/hr plus Pro-Stat 30 mL BID plus Juven BID providing total of 2088 kcals, 144 g of protein and 1169 mL of free water. Peptamen AF not on hospital formulary; plan to substitute equivalent formula  Noted per home meds, pt receives banatrol plus (banana flakes) TID per G-tube. May have contributed some to elevated K+ on admission; not currently ordered  Hypercalcemia, phosphorus wdl, iPTH pending  Labs: CBGs 61-85, corrected calcium 12.8 (H), albumin 1.9, potassium wdl, phosphorus wdl Meds: colace daily, ferrous sulfate, solucortef  8/19 VENTDiet Order:   Diet Order            Diet NPO time  specified  Diet effective now              EDUCATION NEEDS:   Not appropriate for education at this time  Skin:  Skin Assessment: Skin Integrity Issues: Skin Integrity Issues:: DTI DTI: L. heel Stage III: sacrum  Last BM:  8/19 rectal tube  Height:   Ht Readings from Last 1 Encounters:  02/19/19 5\' 11"  (1.803 m)    Weight:   Wt Readings from Last 1 Encounters:  02/19/19 66.4 kg    Ideal Body Weight:     BMI:  Body mass index is 20.42 kg/m.  Estimated Nutritional Needs:   Kcal:  2000-2300 kcals  Protein:  125-145 g  Fluid:  >/= 2 L     Jamyra Zweig MS, RDN, LDN, CNSC [phone] Pager  [phone] Weekend/On-Call Pager

## 2019-02-19 NOTE — Consult Note (Signed)
Consultation Note Date: 02/19/2019   Patient Name: Alexander Scott  DOB: 05-Feb-1951  MRN: [id number]  Age / Sex: 68 y.o., male  PCP: Deanne Coffer, MD Referring Physician: Lucious Groves, DO  Reason for Consultation: Establishing goals of care  HPI/Patient Profile: 68 y.o. male  with past medical history of cervical spine injury s/p trach and peg, traumatic paraplegia secondary to fall from porch May 2020, cardiac arrest, anoxic brain injury, substance use disorder, SDH, and chronic respiratory failure secondary to muscle weakness and increased secretions admitted on 02/18/2019 with hypotension.  Found to have temp of 90.5 and bair hugger initiated. CXR reveals possible pneumonia. Being treated for sepsis. PMT consulted for [place].   Clinical Assessment and Goals of Care: I have reviewed medical records including EPIC notes, labs and imaging, and received report from RN who shares that patient's BP has improved with fluids - never started on pressors. Patient remains on vent - no purposeful interaction.   I then called and spoke with patient's son, Fara Olden, to discuss diagnosis prognosis, [place], EOL wishes, disposition and options.  I introduced Palliative Medicine as specialized medical care for people living with serious illness. It focuses on providing relief from the symptoms and stress of a serious illness. The goal is to improve quality of life for both the patient and the family.  We discussed a brief life review of the patient. [name] tells me that prior to patient's injury in May he was very independent - working 2-3 jobs.   [name] shares that patient has been "doing okay" at [facility] - no real changes. He tells me he has been unable to see him d/t COVID restrictions.    We discussed patient's current illness and what it means in the larger context of patient's on-going co-morbidities.  Discussed treatment with antibiotics and fluids.   [name]  tells me "I just want my dad to be comfortable". He shares that he discussed interventions for his dad's blood pressure and decided for fluids only. We further discussed what [name]'s thoughts were on his dad's "bigger picture" and overall goals of care. [name] shares that his dad has not improved since May and he does not think his father would want to live this way. We discussed options of continuing care his father has been receiving - returning to [facility] with ventilator support (likely with repeated hospitalizations) vs. Freeing his father from medical interventions that are prolonging his life and focusing on full comfort care/allowing a natural death. [name] shares that he is interested in comfort care and ventilator withdrawal. He tells me that this is complicated as there are other family members involved and he fears they will be upset with him and his decision.  We discussed attempting to honor what his father would want when making decisions - [name] shares his father would not want to live this way - he is too independent - and his father would want a natural death.   [name] plans to speak with other family members tonight/tomorrow - he shares that the patient's mother is involved in his care however she is in her 32s and participation is limited. He also shares about his aunt who he would also like to speak with prior to making final decisions.  We discussed possibility of [name] coming to visit the patient. He may do this. If he makes the decision to withdraw care he plans to come and spend some time with the patient and would like to bring his wife - I  shared that this should be okay since we would be transitioning to end of life care.  [name] and I plan to speak again tomorrow after he has time to speak to other family members - we will discuss his decision to possibly pursue withdrawal of care.    Questions and concerns were addressed. The family was encouraged to call with questions or  concerns.   Primary Decision Maker NEXT OF KIN - son [name]  SUMMARY OF RECOMMENDATIONS   - initial discussion with son - son's main goal is to focus on comfort - no pressors if needed - discussed possibility of full comfort care/ ventilator withdrawal - son thinks this is what his dad would want but needs time to discuss this with other family members - PMT plans to speak with son again tomorrow   Code Status/Advance Care Planning:  DNR  Additional Recommendations (Limitations, Scope, Preferences):  No pressors  Prognosis:   Unable to determine - pending son's decision  Discharge Planning: To Be Determined - back to [facility] vs hospital death vs hospice facility      Primary Diagnoses: Present on Admission: . Sepsis ([place]) . Pressure ulcer of sacral region, stage 3 ([place]) . Paraplegia ([place]) . Hypercalcemia   I have reviewed the medical record, interviewed the patient and family, and examined the patient. The following aspects are pertinent.  Past Medical History:  Diagnosis Date  . Acute on chronic respiratory failure with hypoxia ([place])   . Cardiac arrest ([place])   . Cervical spinal cord injury, sequela ([place])   . Pleural effusion associated with hepatic disorder   . Polysubstance abuse ([place])   . S/P placement of cardiac pacemaker   . Subdural hematoma, post-traumatic ([facility])    Social History   Socioeconomic History  . Marital status: Single    Spouse name: Not on file  . Number of children: Not on file  . Years of education: Not on file  . Highest education level: Not on file  Occupational History  . Not on file  Social Needs  . Financial resource strain: Not on file  . Food insecurity    Worry: Not on file    Inability: Not on file  . Transportation needs    Medical: Not on file    Non-medical: Not on file  Tobacco Use  . Smoking status: Not on file  Substance and Sexual Activity  . Alcohol use: Not on file  . Drug use: Not on file  . Sexual activity:  Not on file  Lifestyle  . Physical activity    Days per week: Not on file    Minutes per session: Not on file  . Stress: Not on file  Relationships  . Social Herbalist on phone: Not on file    Gets together: Not on file    Attends religious service: Not on file    Active member of club or organization: Not on file    Attends meetings of clubs or organizations: Not on file    Relationship status: Not on file  Other Topics Concern  . Not on file  Social History Narrative  . Not on file   History reviewed. No pertinent family history. Scheduled Meds: . albuterol  10 mg Nebulization Once  . chlorhexidine gluconate (MEDLINE KIT)  15 mL Mouth Rinse BID  . docusate  100 mg Oral Daily  . famotidine  20 mg Per Tube BID  . [START ON 02/20/2019] feeding supplement (PRO-STAT SUGAR  FREE 64)  30 mL Per Tube Daily  . ferrous sulfate  325 mg Per Tube Daily  . heparin injection (subcutaneous)  5,000 Units Subcutaneous Q8H  . hydrocortisone sod succinate (SOLU-CORTEF) inj  50 mg Intravenous Q6H  . insulin aspart  0-15 Units Subcutaneous TID WC  . mouth rinse  15 mL Mouth Rinse 10 times per day  . nutrition supplement (JUVEN)  1 packet Per Tube Q12H  . pantoprazole sodium  40 mg Per Tube Daily  . sodium chloride flush  10-40 mL Intracatheter Q12H  . sterile water for irrigation   Irrigation Q4H   Continuous Infusions: . ciprofloxacin 400 mg (02/19/19 0330)  . feeding supplement (VITAL AF 1.2 CAL)     PRN Meds:.acetaminophen **OR** acetaminophen, clonazePAM, Digestive Enzyme, promethazine, senna-docusate, sodium chloride flush No Known Allergies  Vital Signs: BP 128/85   Pulse 75   Temp (!) 93.7 F (34.3 C) (Axillary)   Resp 20   Ht 5' 11"  (1.803 m)   Wt 66.4 kg   SpO2 100%   BMI 20.42 kg/m  Pain Scale: CPOT       SpO2: SpO2: 100 % O2 Device:SpO2: 100 % O2 Flow Rate: .   IO: Intake/output summary:   Intake/Output Summary (Last 24 hours) at 02/19/2019 1323 Last  data filed at 02/19/2019 1245 Gross per 24 hour  Intake 2690.11 ml  Output 1625 ml  Net 1065.11 ml    LBM: Last BM Date: 02/19/19 Baseline Weight: Weight: 62.9 kg Most recent weight: Weight: 66.4 kg     Palliative Assessment/Data: PPS 30%    The above conversation was completed via telephone due to the visitor restrictions during the COVID-19 pandemic. Thorough chart review and discussion with necessary members of the care team was completed as part of assessment. All issues were discussed and addressed but no physical exam was performed.  Time Total: 50 minutes Greater than 50%  of this time was spent counseling and coordinating care related to the above assessment and plan.  Juel Burrow, DNP, AGNP-C Palliative Medicine Team [phone] Pager: [phone]

## 2019-02-19 NOTE — Progress Notes (Signed)
Inpatient Diabetes Program Recommendations  AACE/ADA: New Consensus Statement on Inpatient Glycemic Control (2015)  Target Ranges:  Prepandial:   less than 140 mg/dL      Peak postprandial:   less than 180 mg/dL (1-2 hours)      Critically ill patients:  140 - 180 mg/dL   Lab Results  Component Value Date   GLUCAP 85 02/19/2019   HGBA1C 5.2 02/18/2019    Review of Glycemic Control Results for Alexander Scott, Alexander Scott (MRN [id number]) as of 02/19/2019 09:56  Ref. Range 02/18/2019 16:39 02/18/2019 23:51 02/19/2019 00:18 02/19/2019 03:50 02/19/2019 04:17 02/19/2019 07:48  Glucose-Capillary Latest Ref Range: 70 - 99 mg/dL 77 66 (L) 98 61 (L) 77 85    Current orders for Inpatient glycemic control:  Novolog 0-15 units tid  Inpatient Diabetes Program Recommendations:    Hypoglycemia in the setting of Sepsis.   Consider reducing Correction scale to Novolog 0-9 units and change frequency of insulin coverage and CBGs to Q4 hours.   Thanks,  Tama Headings RN, MSN, BC-ADM Inpatient Diabetes Coordinator Team Pager [phone] (8a-5p)

## 2019-02-19 NOTE — H&P (Addendum)
Patient's blood pressure continuing to decline despite 3L of bolus fluids and 161mL/hr maintenance fluids. Last blood pressure 77/56. Another 1L bolus ordered. Due to the patient's progressive hypotension, Dr. Maricela Bo and myself spoke to patient's son, [name]. We discussed patient's current medical state. Despite several attempts to explain the different levels of care including full code, DNR (which patient currently is), and comfort care, patient's son continued to state that he would want [place] to everything but yet wants the patient to remain comfortable. We attempted to explain that these two things do not always go hand in hand initially and we would need a more clear answer in order for Mr. Heuberger's wishes to be best carried out. We discussed vasopressor support and patient's son was agreeable to this.  Critical care was also consulted due to patient's acute declining state of health. We expressed that patient's son would be agreeable to vasopressor support. Critical care stated that they would be unwilling to take patient due to him being DNR. Dr. Zacarias Pontes stated that she had also spoke to patient's son and that he implied patient would only want to be comfort care.  We then called patient's son, Fara Olden again, to clarify this. [name] notes that we should continue to try to resuscitate patient.   With that being said, began patient on solucortef 50mg  q6h, increased fluids to 241mL/hr. Will continue to monitor.

## 2019-02-19 NOTE — Progress Notes (Addendum)
NAME:  Alexander Scott, MRN:  [id number], DOB:  1951-02-11, LOS: 1 ADMISSION DATE:  02/18/2019, CONSULTATION DATE:  02/18/2019 REFERRING MD:  Ronnald Nian, CHIEF COMPLAINT:  Vent management   Brief History   59 yoM [facility] resident with chronic respiratory failure with VDRF, trach and peg with recent treatment for PNA sent for ongoing hypotension.  Patient has been fluid responsive thus far and currently hemodynamically stable and to be admitted to [facility]  PCCM consult for vent/ trach management.   History of present illness   HPI obtained from medical chart review as patient is nonverbal at baseline and chronic trach/ vent.  Limited records sent from [place].   68 year old male with history of chronic respiratory failure with tracheostomy/ VDRF, cervical spinal cord injury with paraplegia, cardiac arrest s/p pacemaker, traumatic SDH, poly substance abuse, GERD and chronic sacral ulcer presenting sent from [facility] for hypotension.   Patient has chronic trach, PEG, and foley with rectal tube from facility.  Given limited records sent, unclear if patient requires mechanical ventilation continuously or just at night.  Recently completed 7 day course of merrem and vancomycin for pneumonia on 8/17.  [facility] held his lasix and gave him a 1L bolus.   In ER, patient found to be initially hypothermic at 90.5 with normal heart rate and blood pressure.  Labs noted for normal lactic acid, Na 133, K 5.4, BUN 83, sCr 0.76, akl phos 163, AST 69, ALT 63, albumin 2.1, WBC 7.5, Hgb 7.5, Hct 24.4, platelets 168, ionized calcium 1.75, cortisol 13.3, COVID neg, UA noted for moderate leukocytes with WBC 11-20, CXR with bibasilar opacities.  Patient placed on bair hugger with improvement in temperature.  Blood pressure trending softer but responsive to additional IV fluids.  Cultures sent and empirically started on vancomycin and cefepime.  Foley changed in ER.  ABG on current vent setting of PRVC 18, TV 510, 5 PEEP, and  FiO2 40% was 7.384/ 41.8/ 90.  PCCM called for possible admission however patient is currently hemodynamically stable, responding to IV fluids and presents with state DNR form from facility.  PCCM therefore consulting for ventilator management only.   Past Medical History  Chronic respiratory failure with tracheostomy/ VDRF, cervical spinal cord injury with paraplegia, cardiac arrest s/p pacemaker, anoxic brain injury, traumatic SDH, poly substance abuse, chronic sacral ulcer, liver cirrhosis  Patient is a DNR- presents with state DNR from from facility. Significant Hospital Events   8/18 Admit [facility]/ PCCM for vent management  Consults:   Procedures:  PTA trach >> PTA RUE SL PICC > currently not flushing  Significant Diagnostic Tests:    Micro Data:  - f/u [facility] cultures  8/18 SARS Cov2 >> neg 8/18 BC x 2 >> 8/18 UC >> 8/18 trach asp >>  Antimicrobials:  S/p 7 day completion of vanc and merrem (completed 8/17)   8/18 cefepime >> 8/18 vanc >> x1 dose 02/18/2019 Cipro>>  Interim history/subjective:   Objective   Blood pressure 113/79, pulse 81, temperature 97.7 F (36.5 C), temperature source Oral, resp. rate 19, height 5\' 11"  (1.803 m), weight 66.4 kg, SpO2 100 %.    Vent Mode: PRVC FiO2 (%):  [40 %-50 %] 40 % Set Rate:  [18 bmp] 18 bmp Vt Set:  [510 mL-600 mL] 510 mL PEEP:  [5 cmH20] 5 cmH20 Plateau Pressure:  [21 cmH20-26 cmH20] 21 cmH20   Intake/Output Summary (Last 24 hours) at 02/19/2019 0865 Last data filed at 02/19/2019 0600 Gross per 24  hour  Intake 2540 ml  Output 325 ml  Net 2215 ml   Filed Weights   02/18/19 1624 02/19/19 0000  Weight: 62.9 kg 66.4 kg   Examination: General: Contracted male who looks older than stated age 61: Tracheostomy in place Neuro: Nonresponsive CV: Sounds are distant PULM: Rhonchi coarse, remains on full ventilatory support GI: Positive bowel sounds Extremities: Contracted bilateral heel dressings in place Skin: Sacral  dressing is in place   Resolved Hospital Problem list    Assessment & Plan:   Chronic respiratory failure with chronic tracheostomy  P:   Maintain full mechanical ventilatory support Goal of saturations is greater than 92% Prn duonebs  Pulmonary toilet trach care Pulmonary hygiene Can attempt spontaneous breathing trials but I suspect underlying decreased mental status will impede any weaning   Bilateral lower lobe opacities/ HCAP UTI Hypotension P:  Maintain the mean arterial pressure greater than 60 Monitor lactic acid DNR therefore vasopressors are not offered   Follow culture data from [facility] and from acute pain culture data from: Pan-cultured here, follow Monitor temperature curve white count Serial chest x-ray Continue empiric Cipro and cefepime for now per primary service   Remainder per primary team.  PCCM will continue to follow for trach / vent management.2 x a week  Best practice:  Diet: NPO Pain/Anxiety/Delirium protocol (if indicated): if needed VAP protocol (if indicated): yes DVT prophylaxis: SCDs, heparin SQ GI prophylaxis: pepcid per tube Glucose control: trend on BMP Mobility: BR Code Status: DNR, presented with state DNR form from facility Family Communication:  Disposition: PCU, ICU for chronic mechanical ventilation   Labs   CBC: Recent Labs  Lab 02/18/19 1636 02/18/19 2101 02/19/19 0206  WBC 7.5  --  16.0*  NEUTROABS 5.9  --   --   HGB 7.5* 8.5* 6.4*  HCT 24.4* 25.0* 19.7*  MCV 96.8  --  90.8  PLT 168  --  222    Basic Metabolic Panel: Recent Labs  Lab 02/18/19 1636 02/18/19 2101 02/19/19 0206  NA 133* 138 137  K 5.4* 5.0 5.1  CL 103  --  109  CO2 22  --  22  GLUCOSE 87  --  62*  BUN 83*  --  74*  CREATININE 0.76  --  0.70  CALCIUM 11.8*  --  11.1*  PHOS  --   --  3.6   GFR: Estimated Creatinine Clearance: 85.3 mL/min (by C-G formula based on SCr of 0.7 mg/dL). Recent Labs  Lab 02/18/19 1636 02/18/19 1645 02/19/19  0206  WBC 7.5  --  16.0*  LATICACIDVEN  --  0.7  --     Liver Function Tests: Recent Labs  Lab 02/18/19 1636 02/19/19 0206  AST 69* 57*  ALT 63* 57*  ALKPHOS 163* 148*  BILITOT 0.3 0.6  PROT 8.2* 7.6  ALBUMIN 2.1* 1.9*   No results for input(s): LIPASE, AMYLASE in the last 168 hours. No results for input(s): AMMONIA in the last 168 hours.  ABG    Component Value Date/Time   PHART 7.384 02/18/2019 2101   PCO2ART 41.8 02/18/2019 2101   PO2ART 90.0 02/18/2019 2101   HCO3 25.2 02/18/2019 2101   TCO2 26 02/18/2019 2101   O2SAT 97.0 02/18/2019 2101     Coagulation Profile: No results for input(s): INR, PROTIME in the last 168 hours.  Cardiac Enzymes: No results for input(s): CKTOTAL, CKMB, CKMBINDEX, TROPONINI in the last 168 hours.  HbA1C: Hgb A1c MFr Bld  Date/Time Value Ref  Range Status  02/18/2019 10:16 PM 5.2 4.8 - 5.6 % Final    Comment:    (NOTE) Pre diabetes:          5.7%-6.4% Diabetes:              >6.4% Glycemic control for   <7.0% adults with diabetes     CBG: Recent Labs  Lab 02/18/19 2351 02/19/19 0018 02/19/19 0350 02/19/19 0417 02/19/19 0748  GLUCAP 66* 98 61* 77 85       App cct 30 min   Steve Minor ACNP Maryanna Shape PCCM Pager [phone] till 1 pm If no answer page [phone] 02/19/2019, 8:21 AM  Attending Note:  68 year old male with chronic respiratory failure s/p trach/peg after spinal cord injury.  Patient presents [facility] from [facility] due to hypotension that now resolved with IVF.  PCCM consulted for vent management.  On exam, coarse BS diffusely.  I reviewed CXR myself, trach is in a good position.  Discussed with PCCM-NP.  Chronic respiratory failure:  - Maintain on chronic vent settings  - May attempted weaning but doubt will be successful   - Titrate O2 for sat of 88-92%  Trach status:  - Trach care per routine  - Maintain current trach size and type  Sepsis:  - Cefepime  - Cefepime  - F/u on cultures and  sensitivities  Hypotension:  - Continue fluid  - D5 at 50 ml/hr  - Stress dose steroids since plasma cortisol is 13.3  - No pressors per conversation between Dr. Zacarias Pontes and son  - ?baseline SBP given spinal cord injury  GOC:  - Would recommend involving palliative care  PCCM will continue to follow.  Patient seen and examined, agree with above note.  I dictated the care and orders written for this patient under my direction.  Rush Farmer, [place]

## 2019-02-19 NOTE — Progress Notes (Signed)
   Subjective: Patient does not respond to verbal or tactile stimuli. Not over breathing the vent.   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 He was aggressively hydrated and started on stress dose steroids before being admitted to the hospital for further evaluation and management.    Goals of care - Patient is vent dependent and unable to communicate  - Palliative care consulted today. They will continue to have [place] discussions with the family. Possibly moving towards withdrawal from vent support and transitioning to full comfort.   Septic shock vs Autonomic dysregulation  - Hypothermia resolved  - BP improved with stress dose steroids, will continue  - Echocardiogram without reasons for cardiogenic or obstructive causes of shock  - Recently completed 7 days of Vanc and Meropenem for VAP - UA with hematuria, pyuria, and bacteruria. Prior urine cultures growing pseudomonas. Continue cipro based on prior culture data  - Continue to follow blood cultures, urine cultures, and respiratory cultures.   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 Continuing tube feeds per nutrition consult - Currently on full vent support. Appreciate PCCM consult.   Hypercalcemia  - Ca  12.7 after correcting for albumin  - PTH pending  - Continue NS   Anemia  - Hgb down to 6.4 on admission  - S/p 1 units pRBCs. Recheck H&H  Diet: G tube feeds  VTE ppx: SubQ Heparin  CODE STATUS: DNR  Dispo: Anticipated discharge pending clinical improvement.   Ina Homes, MD 02/19/2019, 2:18 PM

## 2019-02-20 ENCOUNTER — Inpatient Hospital Stay (HOSPITAL_COMMUNITY): Payer: Medicare Other

## 2019-02-20 DIAGNOSIS — J9601 Acute respiratory failure with hypoxia: Secondary | ICD-10-CM

## 2019-02-20 DIAGNOSIS — S14133S Anterior cord syndrome at C3 level of cervical spinal cord, sequela: Secondary | ICD-10-CM

## 2019-02-20 DIAGNOSIS — Z7189 Other specified counseling: Secondary | ICD-10-CM

## 2019-02-20 DIAGNOSIS — Z515 Encounter for palliative care: Secondary | ICD-10-CM

## 2019-02-20 DIAGNOSIS — R8271 Bacteriuria: Secondary | ICD-10-CM

## 2019-02-20 DIAGNOSIS — E876 Hypokalemia: Secondary | ICD-10-CM

## 2019-02-20 DIAGNOSIS — G8252 Quadriplegia, C1-C4 incomplete: Secondary | ICD-10-CM

## 2019-02-20 DIAGNOSIS — R8281 Pyuria: Secondary | ICD-10-CM

## 2019-02-20 DIAGNOSIS — X58XXXS Exposure to other specified factors, sequela: Secondary | ICD-10-CM

## 2019-02-20 LAB — BASIC METABOLIC PANEL
Anion gap: 9 (ref 5–15)
BUN: 62 mg/dL — ABNORMAL HIGH (ref 8–23)
CO2: 22 mmol/L (ref 22–32)
Calcium: 10.8 mg/dL — ABNORMAL HIGH (ref 8.9–10.3)
Chloride: 109 mmol/L (ref 98–111)
Creatinine, Ser: 0.63 mg/dL (ref 0.61–1.24)
GFR calc Af Amer: >60 mL/min (ref >60)
GFR calc non Af Amer: >60 mL/min (ref >60)
Glucose, Bld: 143 mg/dL — ABNORMAL HIGH (ref 70–99)
Potassium: 3.1 mmol/L — ABNORMAL LOW (ref 3.5–5.1)
Sodium: 140 mmol/L (ref 135–145)

## 2019-02-20 LAB — GLUCOSE, CAPILLARY
Glucose-Capillary: 109 mg/dL — ABNORMAL HIGH (ref 70–99)
Glucose-Capillary: 112 mg/dL — ABNORMAL HIGH (ref 70–99)
Glucose-Capillary: 116 mg/dL — ABNORMAL HIGH (ref 70–99)
Glucose-Capillary: 118 mg/dL — ABNORMAL HIGH (ref 70–99)
Glucose-Capillary: 121 mg/dL — ABNORMAL HIGH (ref 70–99)
Glucose-Capillary: 135 mg/dL — ABNORMAL HIGH (ref 70–99)
Glucose-Capillary: 147 mg/dL — ABNORMAL HIGH (ref 70–99)

## 2019-02-20 LAB — BPAM RBC
Blood Product Expiration Date: 202009132359
ISSUE DATE / TIME: 202008191205
Unit Type and Rh: 6200

## 2019-02-20 LAB — PTH, INTACT AND CALCIUM
Calcium, Total (PTH): 11 mg/dL — ABNORMAL HIGH (ref 8.6–10.2)
PTH: 6 pg/mL — ABNORMAL LOW (ref 15–65)

## 2019-02-20 LAB — TYPE AND SCREEN
ABO/RH(D): A POS
Antibody Screen: NEGATIVE
Unit division: 0

## 2019-02-20 LAB — PHOSPHORUS
Phosphorus: 3 mg/dL (ref 2.5–4.6)
Phosphorus: 3 mg/dL (ref 2.5–4.6)

## 2019-02-20 LAB — MAGNESIUM
Magnesium: 2.1 mg/dL (ref 1.7–2.4)
Magnesium: 1.7 mg/dL (ref 1.7–2.4)

## 2019-02-20 MED ORDER — FENTANYL CITRATE (PF) 100 MCG/2ML IJ SOLN
50.0000 ug | INTRAMUSCULAR | Status: DC | PRN
  Administered 2019-02-20 – 2019-02-21 (×6): 50 ug via INTRAVENOUS
  Filled 2019-02-20 (×6): qty 2

## 2019-02-20 MED ORDER — POTASSIUM CHLORIDE 10 MEQ/100ML IV SOLN
10.0000 meq | INTRAVENOUS | Status: AC
  Administered 2019-02-20 (×5): 10 meq via INTRAVENOUS
  Filled 2019-02-20 (×5): qty 100

## 2019-02-20 NOTE — Progress Notes (Addendum)
   Subjective:  Alexander Scott was seen at bedside today. He was not interactive during the interview, but he did appear to be in discomfort. He would not make eye contact, but was shifting positions.    Objective: Vital signs in last 24 hours: Vitals:   02/20/19 0315 02/20/19 0400 02/20/19 0500 02/20/19 0600  BP: 132/82 127/86 135/88 132/84  Pulse: (!) 105 98 (!) 102 90  Resp: 20 (!) 22 (!) 22 20  Temp:  99.7 F (37.6 C)    TempSrc:  Axillary    SpO2: 98% 97% 100% 99%  Weight:   65.1 kg   Height:       Physical Exam Constitutional:      General: He is not in acute distress.    Appearance: He is not ill-appearing, toxic-appearing or diaphoretic.  HENT:     Head: Normocephalic and atraumatic.  Cardiovascular:     Rate and Rhythm: Normal rate and regular rhythm.     Heart sounds: No murmur. No friction rub. No gallop.   Pulmonary:     Effort: Pulmonary effort is normal. No respiratory distress.     Breath sounds: Normal breath sounds. No wheezing.  Abdominal:     General: Abdomen is flat. Bowel sounds are normal.  Musculoskeletal:        General: No swelling.   Opened eyes to touch, but did not make eye contact or follow any commands.  Assessment/Plan:  Goals of care: - Patient is vent dependent and unable to communicate.  - Palliative care consulted, they spoke with the son. He is speaking with the family for possibility of palliative with withdrawal of the vent. Palliative is corresponding with [place], and we appreciate their help with Alexander Scott case.    Septic shock vs Autonomic dysregulation:  - Hypothermia resolved (97.6 F) - BP 135/85, Discontinue stress steroids - Echocardiogram without reasons for cardiogenic or obstructive causes of shock  - Recently completed 7 days of Vanc and Meropenem for VAP. - Continue course of ciprofloxacin and cefepime.  - UA with hematuria, pyuria, and bacteruria. Prior urine cultures growing pseudomonas. Continue cipro based on prior  culture data  - Continue to follow blood cultures, urine cultures, and respiratory cultures.Pseudomonas aeruginosa pneumonia/VAP is likely cause, will narrow abx based on sensitivities  -Respiratory culture: Pseudomonoas aeruginosa. Susceptibilities pending.    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 Continuing tube feeds per nutrition consult. - Currently on full vent support. Appreciate PCCM consult.   Hypercalcemia: - Ca 11  - Renal panel tomorrow  - PTH: 6  - Continue NS  Not PTH mediated, check 25 and 1,25 vitamin d and PTHrp  Hypokalemia:  - K: 3.1 - Poassium chloride IV 10 mEq in 100 IVPB 1 hrx5  Anemia: - Hgb: 8.6 - Continue to monitor  Diet: G tube feeds  VTE ppx: SubQ Heparin  CODE STATUS: DNR  Dispo: Anticipated discharge pending clinical course/goals of care discussions.   Maudie Mercury, MD 02/20/2019, 7:09 AM   Internal Medicine Attending:   I saw and examined the patient. I reviewed the resident's note and I agree with the resident's findings and plan as documented in the resident's note.  Lucious Groves, DO 12:25 PM

## 2019-02-20 NOTE — Progress Notes (Signed)
Called the patient's son, Carlean Purl, to provide a medical update. We discussed the the respiratory cultures are growing Pseudomonas and we would tailor antibiotics accordingly. He has not talked with palliative care today. He lives out of town and voices that it is difficult for him to make it to the hospital to see/visit his father.   Ina Homes, MD  IMTS PGY3  Pager: [phone]

## 2019-02-20 NOTE — Progress Notes (Addendum)
NAME:  RITHVIK WALWORTH, MRN:  [id number], DOB:  05/29/1951, LOS: 2 ADMISSION DATE:  02/18/2019, CONSULTATION DATE:  02/18/2019 REFERRING MD:  Ronnald Nian, CHIEF COMPLAINT:  Vent management   Brief History   68 yoM [facility] resident with chronic respiratory failure with VDRF, trach and peg with recent treatment for PNA sent for ongoing hypotension.  Patient has been fluid responsive thus far and currently hemodynamically stable and to be admitted to [facility].  PCCM consult for vent/ trach management.   History of present illness   HPI obtained from medical chart review as patient is nonverbal at baseline and chronic trach/ vent.  Limited records sent from [facility].   68 year old male with history of chronic respiratory failure with tracheostomy/ VDRF, cervical spinal cord injury with paraplegia, cardiac arrest s/p pacemaker, traumatic SDH, poly substance abuse, GERD and chronic sacral ulcer presenting sent from [facility] for hypotension.   Patient has chronic trach, PEG, and foley with rectal tube from facility.  Given limited records sent, unclear if patient requires mechanical ventilation continuously or just at night.  Recently completed 7 day course of merrem and vancomycin for pneumonia on 8/17.  [facility] held his lasix and gave him a 1L bolus.   In ER, patient found to be initially hypothermic at 90.5 with normal heart rate and blood pressure.  Labs noted for normal lactic acid, Na 133, K 5.4, BUN 83, sCr 0.76, akl phos 163, AST 69, ALT 63, albumin 2.1, WBC 7.5, Hgb 7.5, Hct 24.4, platelets 168, ionized calcium 1.75, cortisol 13.3, COVID neg, UA noted for moderate leukocytes with WBC 11-20, CXR with bibasilar opacities.  Patient placed on bair hugger with improvement in temperature.  Blood pressure trending softer but responsive to additional IV fluids.  Cultures sent and empirically started on vancomycin and cefepime.  Foley changed in ER.  ABG on current vent setting of PRVC 18, TV 510, 5 PEEP, and  FiO2 40% was 7.384/ 41.8/ 90.  PCCM called for possible admission however patient is currently hemodynamically stable, responding to IV fluids and presents with state DNR form from facility.  PCCM therefore consulting for ventilator management only.   Past Medical History  Chronic respiratory failure with tracheostomy/ VDRF, cervical spinal cord injury with paraplegia, cardiac arrest s/p pacemaker, anoxic brain injury, traumatic SDH, poly substance abuse, chronic sacral ulcer, liver cirrhosis  Patient is a DNR- presents with state DNR from from facility. Significant Hospital Events   8/18 Admit [facility]/ PCCM for vent management  Consults:   Procedures:  PTA trach >> PTA RUE SL PICC > currently not flushing  Significant Diagnostic Tests:    Micro Data:  - f/u [facility] cultures  8/18 SARS Cov2 >> neg 8/18 BC x 2 >> 8/18 UC >> 8/18 trach asp >>  Antimicrobials:  S/p 7 day completion of vanc and merrem (completed 8/17)   8/18 cefepime >> 8/18 vanc >> x1 dose 02/18/2019 Cipro>>  Interim history/subjective:   Objective   Blood pressure 119/78, pulse 92, temperature 99.7 F (37.6 C), temperature source Axillary, resp. rate 18, height 5\' 11"  ([id number] m), weight 65.1 kg, SpO2 97 %.    Vent Mode: PRVC FiO2 (%):  [30 %-40 %] 30 % Set Rate:  [18 bmp] 18 bmp Vt Set:  [510 mL] 510 mL PEEP:  [5 cmH20] 5 cmH20 Pressure Support:  [10 cmH20] 10 cmH20 Plateau Pressure:  [16 cmH20-24 cmH20] 16 cmH20   Intake/Output Summary (Last 24 hours) at 02/20/2019 0808 Last data filed  at 02/20/2019 0600 Gross per 24 hour  Intake 1381.58 ml  Output 2850 ml  Net -1468.42 ml   Filed Weights   02/18/19 1624 02/19/19 0000 02/20/19 0500  Weight: 62.9 kg 66.4 kg 65.1 kg   Examination: General: Frail cachectic contracted male HEENT: Tracheostomy in place  Neuro: Quadriplegic nonresponsive CV: Sounds are distant PULM: Decreased breath sounds in the bases GI: soft, bsx4 active  Extremities:  Contracted Skin: Bilateral heel ulcers and sacral ulcer noted    Resolved Hospital Problem list    Assessment & Plan:   Chronic respiratory failure with chronic tracheostomy in the setting of quadriplegia P:   Currently on full mechanical ventilatory support FiO2 for sats greater than 92% Unco-dilators Pulmonary toilet Wean as tolerated   Bilateral lower lobe opacities/ HCAP UTI Hypotension P:  Maintain mean arterial pressure greater than 60 Last lactic acid is 0.7 He is a DNR therefore vasopressors are not offered Continue to follow culture data from [facility] Follow: Culture data Monitor temperature curve white count Serial chest x-ray Continue empirical antimicrobial therapy per primary service   Remainder per primary team.  Pulmonary critical care will see again on Monday or sooner if needed.  Best practice:  Diet: NPO Pain/Anxiety/Delirium protocol (if indicated): if needed VAP protocol (if indicated): yes DVT prophylaxis: SCDs, heparin SQ GI prophylaxis: pepcid per tube Glucose control: trend on BMP Mobility: BR Code Status: DNR, presented with state DNR form from facility.  Internal medicine service has contacted palliative care for goals of care discussion. Family Communication:  Disposition: PCU, ICU for chronic mechanical ventilation   Labs   CBC: Recent Labs  Lab 02/18/19 1636 02/18/19 2101 02/19/19 0206 02/19/19 1918  WBC 7.5  --  16.0*  --   NEUTROABS 5.9  --   --   --   HGB 7.5* 8.5* 6.4* 8.6*  HCT 24.4* 25.0* 19.7* 26.9*  MCV 96.8  --  90.8  --   PLT 168  --  171  --     Basic Metabolic Panel: Recent Labs  Lab 02/18/19 1636 02/18/19 2101 02/19/19 0206 02/19/19 1226 02/19/19 1918 02/20/19 0454  NA 133* 138 137  --   --  140  K 5.4* 5.0 5.1  --   --  3.1*  CL 103  --  109  --   --  109  CO2 22  --  22  --   --  22  GLUCOSE 87  --  62*  --   --  143*  BUN 83*  --  74*  --   --  62*  CREATININE 0.76  --  0.70  --   --  0.63   CALCIUM 11.8*  --  11.1*  --   --  10.8*  MG  --   --   --  1.4* 1.4* 2.1  PHOS  --   --  3.6 4.3 4.2 3.0   GFR: Estimated Creatinine Clearance: 83.6 mL/min (by C-G formula based on SCr of 0.63 mg/dL). Recent Labs  Lab 02/18/19 1636 02/18/19 1645 02/19/19 0206  WBC 7.5  --  16.0*  LATICACIDVEN  --  0.7  --     Liver Function Tests: Recent Labs  Lab 02/18/19 1636 02/19/19 0206  AST 69* 57*  ALT 63* 57*  ALKPHOS 163* 148*  BILITOT 0.3 0.6  PROT 8.2* 7.6  ALBUMIN 2.1* 1.9*   No results for input(s): LIPASE, AMYLASE in the last 168 hours. No results for input(s): AMMONIA  in the last 168 hours.  ABG    Component Value Date/Time   PHART 7.384 02/18/2019 2101   PCO2ART 41.8 02/18/2019 2101   PO2ART 90.0 02/18/2019 2101   HCO3 25.2 02/18/2019 2101   TCO2 26 02/18/2019 2101   O2SAT 97.0 02/18/2019 2101     Coagulation Profile: No results for input(s): INR, PROTIME in the last 168 hours.  Cardiac Enzymes: No results for input(s): CKTOTAL, CKMB, CKMBINDEX, TROPONINI in the last 168 hours.  HbA1C: Hgb A1c MFr Bld  Date/Time Value Ref Range Status  02/18/2019 10:16 PM 5.2 4.8 - 5.6 % Final    Comment:    (NOTE) Pre diabetes:          5.7%-6.4% Diabetes:              >6.4% Glycemic control for   <7.0% adults with diabetes     CBG: Recent Labs  Lab 02/19/19 1212 02/19/19 1459 02/19/19 1948 02/20/19 0038 02/20/19 0445  GLUCAP 125* 122* 126* 147* 135*    Steve Minor ACNP Maryanna Shape PCCM Pager [phone] till 1 pm If no answer page [phone] 02/20/2019, 8:08 AM  Attending Note:  68 year old male with extensive PMH who presents to PCCM for vent management, patient is a chronic vent/trach that is not weaning.  Patient is in the hospital for septic shock due to wound related infection.  We had a conversation with the son upon admission and patient became a full DNR and pressors were stopped.  Patient responded to abx but remains failing to wean.  On exam,  the patient is grimacing in pain and I am concerned that we are prolonging suffering at this point.  I reviewed CXR myself, trach is in a good position and chronic changes noted.  Palliative care spoke to son.  He believes that his father would not want more aggressive interventions and that comfort should be paramount at this point.  He wanted to discuss more with the rest of the family however.  Will not escalate care at this point.  Will await further communication with the son.  Contacted palliative care and they will spearhead the process.   The patient is critically ill with multiple organ systems failure and requires high complexity decision making for assessment and support, frequent evaluation and titration of therapies, application of advanced monitoring technologies and extensive interpretation of multiple databases.   Critical Care Time devoted to patient care services described in this note is  31  Minutes. This time reflects time of care of this signee Dr Jennet Maduro. This critical care time does not reflect procedure time, or teaching time or supervisory time of PA/NP/Med student/Med Resident etc but could involve care discussion time.  Rush Farmer, M.D. [facility] Pulmonary/Critical Care Medicine. Pager: [phone]. After hours pager: [phone].

## 2019-02-20 NOTE — Progress Notes (Signed)
Palliative:  Called son earlier today - left voicemail with callback number. Have not received return call. Will attempt again in hopes of continued discussion of [place].   Juel Burrow, DNP, AGNP-C Palliative Medicine Team Team Phone # [phone]  Pager # [phone]  NO CHARGE

## 2019-02-21 DIAGNOSIS — J95851 Ventilator associated pneumonia: Secondary | ICD-10-CM

## 2019-02-21 DIAGNOSIS — R6521 Severe sepsis with septic shock: Secondary | ICD-10-CM

## 2019-02-21 DIAGNOSIS — A4152 Sepsis due to Pseudomonas: Secondary | ICD-10-CM

## 2019-02-21 DIAGNOSIS — B965 Pseudomonas (aeruginosa) (mallei) (pseudomallei) as the cause of diseases classified elsewhere: Secondary | ICD-10-CM

## 2019-02-21 LAB — RENAL FUNCTION PANEL
Albumin: 2.1 g/dL — ABNORMAL LOW (ref 3.5–5.0)
Anion gap: 9 (ref 5–15)
BUN: 50 mg/dL — ABNORMAL HIGH (ref 8–23)
CO2: 23 mmol/L (ref 22–32)
Calcium: 10.5 mg/dL — ABNORMAL HIGH (ref 8.9–10.3)
Chloride: 110 mmol/L (ref 98–111)
Creatinine, Ser: 0.52 mg/dL — ABNORMAL LOW (ref 0.61–1.24)
GFR calc Af Amer: >60 mL/min (ref >60)
GFR calc non Af Amer: >60 mL/min (ref >60)
Glucose, Bld: 103 mg/dL — ABNORMAL HIGH (ref 70–99)
Phosphorus: 2.7 mg/dL (ref 2.5–4.6)
Potassium: 3 mmol/L — ABNORMAL LOW (ref 3.5–5.1)
Sodium: 142 mmol/L (ref 135–145)

## 2019-02-21 LAB — CULTURE, RESPIRATORY W GRAM STAIN

## 2019-02-21 LAB — GLUCOSE, CAPILLARY
Glucose-Capillary: 101 mg/dL — ABNORMAL HIGH (ref 70–99)
Glucose-Capillary: 92 mg/dL (ref 70–99)
Glucose-Capillary: 103 mg/dL — ABNORMAL HIGH (ref 70–99)
Glucose-Capillary: 92 mg/dL (ref 70–99)
Glucose-Capillary: 98 mg/dL (ref 70–99)

## 2019-02-21 LAB — CBC
HCT: 24.4 % — ABNORMAL LOW (ref 39.0–52.0)
Hemoglobin: 8 g/dL — ABNORMAL LOW (ref 13.0–17.0)
MCH: 28.5 pg (ref 26.0–34.0)
MCHC: 32.8 g/dL (ref 30.0–36.0)
MCV: 86.8 fL (ref 80.0–100.0)
Platelets: 155 10*3/uL (ref 150–400)
RBC: 2.81 MIL/uL — ABNORMAL LOW (ref 4.22–5.81)
RDW: 18.5 % — ABNORMAL HIGH (ref 11.5–15.5)
WBC: 8.6 10*3/uL (ref 4.0–10.5)
nRBC: 0.7 % — ABNORMAL HIGH (ref 0.0–0.2)

## 2019-02-21 LAB — VITAMIN D 25 HYDROXY (VIT D DEFICIENCY, FRACTURES): Vit D, 25-Hydroxy: 32.2 ng/mL (ref 30.0–100.0)

## 2019-02-21 LAB — CALCITRIOL (1,25 DI-OH VIT D): Vit D, 1,25-Dihydroxy: 9.3 pg/mL — ABNORMAL LOW (ref 19.9–79.3)

## 2019-02-21 MED ORDER — POTASSIUM CHLORIDE 20 MEQ PO PACK
40.0000 meq | PACK | Freq: Once | ORAL | Status: AC
  Administered 2019-02-21: 40 meq via ORAL
  Filled 2019-02-21: qty 2

## 2019-02-21 NOTE — TOC Progression Note (Signed)
Transition of Care [facility]) - Progression Note    Patient Details  Name: Alexander Scott MRN: [id number] Date of Birth: [birth year]  [facility] CM/SW Contact  Bartholomew Crews, RN Phone Number: [phone] 02/21/2019, 11:53 AM  Clinical Narrative:    Left voicemail for patient son with CM contact information and requesting call back to discuss where patient is transitioning today.    Expected Discharge Plan: Long Term Acute Care (LTAC) Barriers to Discharge: Continued Medical Work up  Expected Discharge Plan and Services Expected Discharge Plan: [place] (LTAC)   Discharge Planning Services: CM Consult Post Acute Care Choice: Long Term Acute Care (LTAC) Living arrangements for the past 2 months: [place] Expected Discharge Date: 02/21/19               DME Arranged: N/A DME Agency: NA       HH Arranged: NA HH Agency: NA         Social Determinants of Health (SDOH) Interventions    Readmission Risk Interventions No flowsheet data found.

## 2019-02-21 NOTE — TOC Transition Note (Signed)
Transition of Care [facility]) - CM/SW Discharge Note   Patient Details  Name: Alexander Scott MRN: [id number] Date of Birth: [birth year]  [facility]) CM/SW Contact:  Bartholomew Crews, RN Phone Number: [phone] 02/21/2019, 3:23 PM   Clinical Narrative:    Patient to transition to [facility]. PTA was at [facility]. Patient to go to room 407. ICU RN to call report to [phone]. Dr. Barbie Haggis is the accepting physician. [facility] has been contacted for transport.    Final next level of care: Long Term Acute Care (LTAC) Barriers to Discharge: No Barriers Identified   Patient Goals and CMS Choice   CMS Medicare.gov Compare Post Acute Care list provided to:: Patient Represenative (must comment)(Spoke with mother-Sally Lovena Le 0 [name] at [facility] spoke with sister-Willie Vicente Serene) Choice offered to / list presented to : Parent, Sibling  Discharge Placement              Patient chooses bed at: [place] Patient to be transferred to facility by: [place] Name of family member notified: Shloimy Boren Patient and family notified of of transfer: 02/21/19  Discharge Plan and Services   Discharge Planning Services: CM Consult Post Acute Care Choice: Long Term Acute Care (LTAC)          DME Arranged: N/A DME Agency: NA       HH Arranged: NA HH Agency: NA        Social Determinants of Health (SDOH) Interventions     Readmission Risk Interventions No flowsheet data found.

## 2019-02-21 NOTE — Progress Notes (Signed)
Student pharmacist rounding with the IMTS-B1 service observed the potential for medication optimization. The patient is on day 3 of hospital admission in the ICU for septic shock vs autonomic dysregulation. The patient recently finished a 7-day regimen of vancomycin and meropenem at an outside facility and currently is on ciprofloxacin 400mg  IV every 12 hours. I have reviewed the patient's profile and find the creatinine clearance listed as 83.8 mL/min. Respiratory cultures collected on 8/19 grew pseudomonas aeruginosa with susceptibilities to ceftazidime and Zosyn. After a discussion with the medical resident, a recommendation was made to switch therapy to ceftazidime 2g every 8 hours. This dose is appropriate given the patient's renal function. Continue to assess the patient for clinical improvement and goals of care.  Youlanda Roys, 4th year Stage manager

## 2019-02-21 NOTE — Progress Notes (Signed)
   Subjective:  Mr. Alexander Scott was seen at bedside today. He was not responsive to verbal or physical stimuli. Halfway through the interview he briefly opened his left eye.  Objective: Vital signs in last 24 hours: Vitals:   02/21/19 0400 02/21/19 0403 02/21/19 0500 02/21/19 0600  BP: (!) 154/92  140/86 (!) 145/95  Pulse: 80  74 80  Resp: 19  17 17   Temp:  (!) 97.4 F (36.3 C)    TempSrc:  Axillary    SpO2: 100%  100% 100%  Weight:      Height:       Physical Exam Constitutional:      General: He is not in acute distress.    Appearance: He is not ill-appearing, toxic-appearing or diaphoretic.  HENT:     Head: Normocephalic and atraumatic.  Cardiovascular:     Rate and Rhythm: Normal rate and regular rhythm.     Heart sounds: No murmur. No friction rub. No gallop.   Pulmonary:     Effort: Pulmonary effort is normal. No respiratory distress.     Breath sounds: Normal breath sounds. No wheezing.  Abdominal:     General: Abdomen is flat. Bowel sounds are normal.  Musculoskeletal:        General: No swelling.     Assessment/Plan:  Goals of care: - Patient is vent dependent and unable to communicate.  - Palliative care    Septic shock vs Autonomic dysregulation:  - Hypothermia resolved  - BP 145/95 - HR: 80 - WBC: 8.6 - Echocardiogram without reasons for cardiogenic or obstructive causes of shock  - Recently completed 7 days of Vanc and Meropenem for VAP. - Discontinued ciprofloxacin.  - Continue to follow blood cultures, urine cultures, and respiratory cultures.  -Respiratory culture: Pseudomonoas aeruginosa pneumonia/VAP likely the sources.  - Susceptible to ceftazidime and pip/tazo. 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  Continuing tube feeds per nutrition consult. - Currently on full vent support. Appreciate PCCM consult.   Hypercalcemia: - Ca: 11  - PTH: 6 - Vitamin D 25: 32.2 - Calcitriol 1,25: Pending - Continue NS: Pending   Hypokalemia:  - K: 3.0 - Ordered Potassium chloride 40 mg packet.   Anemia: - Hgb: 8.0 - Continue to monitor  Diet: G tube feeds  VTE ppx: SubQ Heparin  CODE STATUS: DNR  Dispo: Anticipated discharge pending clinical course/goals of care discussions.   Maudie Mercury, MD 02/21/2019, 6:46 AM

## 2019-02-21 NOTE — Discharge Summary (Signed)
Name: Alexander Scott MRN: [id number] DOB: 19-Oct-1950 68 y.o. PCP: Deanne Coffer, MD  Date of Admission: 02/18/2019  4:12 PM Date of Discharge: 02/21/2019 Attending Physician: Lucious Groves, DO  Discharge Diagnosis: 1. Shock / VAP  2. Hypercalcemia  3. Seltzer  Discharge Medications: Allergies as of 02/21/2019   No Known Allergies     Medication List    STOP taking these medications   sodium chloride 0.45 % solution   vancomycin 1,000 mg in sodium chloride 0.9 % 250 mL     TAKE these medications   amantadine 50 MG/5ML solution Commonly known as: SYMMETREL Place 50 mg into feeding tube 2 (two) times daily.   Banatrol plus Pack Place 1 packet into feeding tube 3 (three) times daily.   chlorhexidine 0.12 % solution Commonly known as: PERIDEX 15 mLs by Mouth Rinse route 2 (two) times daily.   clonazePAM 0.5 MG tablet Commonly known as: KLONOPIN Place 0.5 mg into feeding tube every 12 (twelve) hours as needed for anxiety.   Digestive Enzyme Caps Place 220 mg into feeding tube daily as needed (AS DIRECTED).   docusate sodium 100 MG capsule Commonly known as: COLACE 100 mg See admin instructions. 100 mg per tube once a day   feeding supplement (PRO-STAT SUGAR FREE 64) Liqd Place 30 mLs into feeding tube every 12 (twelve) hours.   ferrous sulfate 300 (60 Fe) MG/5ML syrup Place 325 mg into feeding tube daily.   miconazole 2 % powder Commonly known as: MICOTIN Apply 1 application topically as needed for itching ((affected areas)).   NovoLOG FlexPen 100 UNIT/ML FlexPen Generic drug: insulin aspart Inject 0-12 Units into the skin See admin instructions. Inject 0-12 units into the skin every 6 hours, per sliding scale: "BGL <70 or >400, CALL MD; 151-200 = 2 units; 201-250 = 4 units; 251-300 = 6 units; 351-400 = 10 units; 401-500 = 12 units"   ondansetron 4 MG/2ML Soln injection Commonly known as: ZOFRAN Inject 4 mg into the vein daily as needed for nausea or  vomiting.   pantoprazole 40 MG tablet Commonly known as: PROTONIX 40 mg See admin instructions. 40 mg per tube once a day   Peptamen AF Liqd Place 60 mLs into feeding tube continuous. 60 ml/hr   Juven Nutrivigor Pack Place 1 packet into feeding tube every 12 (twelve) hours.   sodium bicarbonate 650 MG tablet Place 650 mg into feeding tube daily as needed for heartburn.   sterile water injection Place 100 mLs into feeding tube every 4 (four) hours.     Disposition and follow-up:   Mr.Alexander Scott was discharged from [facility] in Stable condition.  At the hospital follow up visit please address:  1.  GOC. Continue to coordinate with Carlean Purl on goal of care. Hypercalcemia. Continue to follow the patient's calcium. I suspect this is from immobility. Vitamin D 1, 25 and PTHrp are pending.   2.  Labs / imaging needed at time of follow-up: None  3.  Pending labs/ test needing follow-up: Vitamin D 1, 25 and PTHrp   Follow-up Appointments: Patient will follow-up at [facility] by problem list:  1. Shock / VAP. 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 Prior to admission he was being treated for ventilator associated pneumonia with vancomycin and meropenem. Critical care was consulted and in incoordination with  the patient's son it was determined that the patient was not a candidate for pressor support. Instead he was started on stress the steroids with significant improvement in his blood pressure. He was treated with Cipro based on prior culture results. Respiratory cultures subsequently grew out Pseudomonas that was sensitive to Zosyn and Ceftaz. Given the patient's significant improvement antibiotics were discontinued and he was [facility] in [facility] condition.  2. Hypercalcemia. On admission the  patient was found to be hypercalcemic. PTH was obtained and found to be appropriately low. The patient's vitamin D 25 level was on the low end of normal; however, we are still waiting on the patient's vitamin D 1, 25 and PTH related peptide. I suspect this is from immobility.  3. GOC. Palliative care was consulted while the patient was admitted to [facility]. They spoke with the patient's son Willodean Rosenthal. Their summary of recommendations can be seen below. Unfortunately we cannot further elicit the sons wishes. We would recommend continuing goals of care discussion with the patient's son. - initial discussion with son - son's main goal is to focus on comfort - no pressors if needed - discussed possibility of full comfort care/ ventilator withdrawal - son thinks this is what his dad would want but needs time to discuss this with other family members - PMT plans to speak with son again tomorrow   Discharge Vitals:   BP (!) 148/100   Pulse 87   Temp 97.9 F (36.6 C) (Axillary)   Resp 20   Ht 5\' 11"  (1.803 m)   Wt 65.2 kg   SpO2 100%   BMI 20.05 kg/m   Pertinent Labs, Studies, and Procedures:  Results for orders placed or performed during the hospital encounter of 02/18/19  Blood Culture (routine x 2)     Status: None (Preliminary result)   Collection Time: 02/18/19  4:45 PM   Specimen: BLOOD RIGHT FOREARM  Result Value Ref Range Status   Specimen Description BLOOD RIGHT FOREARM  Final   Special Requests   Final    BOTTLES DRAWN AEROBIC AND ANAEROBIC Blood Culture results may not be optimal due to an inadequate volume of blood received in culture bottles   Culture   Final    NO GROWTH 3 DAYS Performed at [facility], [address] [place], [place] [address]    Report Status PENDING  Incomplete  Blood Culture (routine x 2)     Status: None (Preliminary result)   Collection Time: 02/18/19  5:00 PM   Specimen: BLOOD LEFT HAND  Result Value Ref Range Status   Specimen  Description BLOOD LEFT HAND  Final   Special Requests   Final    BOTTLES DRAWN AEROBIC AND ANAEROBIC Blood Culture results may not be optimal due to an inadequate volume of blood received in culture bottles   Culture   Final    NO GROWTH 3 DAYS Performed at [facility], [place] [address] [place], [place] [address]    Report Status PENDING  Incomplete  Urine culture     Status: None   Collection Time: 02/18/19  5:20 PM   Specimen: In/Out Cath Urine  Result Value Ref Range Status   Specimen Description IN/OUT CATH URINE  Final   Special Requests NONE  Final   Culture   Final    NO GROWTH Performed at [facility], [place] [address], [place], [place] [address]    Report Status 02/19/2019 FINAL  Final  SARS Coronavirus 2 [facility] order, Performed in [facility] hospital lab) Nasopharyngeal Nasopharyngeal Swab     Status: None   Collection Time: 02/18/19  5:53 PM   Specimen: Nasopharyngeal Swab  Result Value Ref Range Status   SARS Coronavirus 2 NEGATIVE NEGATIVE Final    Comment: (NOTE) If result is NEGATIVE SARS-CoV-2 target nucleic acids are NOT DETECTED. The SARS-CoV-2 RNA is generally detectable in upper and lower  respiratory specimens during the acute phase of infection. The lowest  concentration of SARS-CoV-2 viral copies this assay can detect is 250  copies / mL. A negative result does not preclude SARS-CoV-2 infection  and should not be used as the sole basis for treatment or other  patient management decisions.  A negative result may occur with  improper specimen collection / handling, submission of specimen other  than nasopharyngeal swab, presence of viral mutation(s) within the  areas targeted by this assay, and inadequate number of viral copies  (<250 copies / mL). A negative result must be combined with clinical  observations, patient history, and epidemiological information. If result is POSITIVE SARS-CoV-2 target nucleic acids are DETECTED. The SARS-CoV-2  RNA is generally detectable in upper and lower  respiratory specimens dur ing the acute phase of infection.  Positive  results are indicative of active infection with SARS-CoV-2.  Clinical  correlation with patient history and other diagnostic information is  necessary to determine patient infection status.  Positive results do  not rule out bacterial infection or co-infection with other viruses. If result is PRESUMPTIVE POSTIVE SARS-CoV-2 nucleic acids MAY BE PRESENT.   A presumptive positive result was obtained on the submitted specimen  and confirmed on repeat testing.  While 2019 novel coronavirus  (SARS-CoV-2) nucleic acids may be present in the submitted sample  additional confirmatory testing may be necessary for epidemiological  and / or clinical management purposes  to differentiate between  SARS-CoV-2 and other Sarbecovirus currently known to infect humans.  If clinically indicated additional testing with an alternate test  methodology [phone]) is advised. The SARS-CoV-2 RNA is generally  detectable in upper and lower respiratory sp ecimens during the acute  phase of infection. The expected result is Negative. Fact Sheet for Patients:  StrictlyIdeas.no Fact Sheet for Healthcare Providers: BankingDealers.co.za This test is not yet approved or cleared by the [place] FDA and has been authorized for detection and/or diagnosis of SARS-CoV-2 by FDA under an Emergency Use Authorization (EUA).  This EUA will remain in effect (meaning this test can be used) for the duration of the COVID-19 declaration under Section 564(b)(1) of the Act, 21 U.S.C. section 360bbb-3(b)(1), unless the authorization is terminated or revoked sooner. Performed at [facility], [place] [address] [place], [place] [address]   MRSA PCR Screening     Status: None   Collection Time: 02/18/19 11:54 PM   Specimen: Nasal Mucosa; Nasopharyngeal  Result  Value Ref Range Status   MRSA by PCR NEGATIVE NEGATIVE Final    Comment:        The GeneXpert MRSA Assay (FDA approved for NASAL specimens only), is one component of a comprehensive MRSA colonization surveillance program. It is not intended to diagnose MRSA infection nor to guide or monitor treatment for MRSA infections. Performed at [facility], [place] [address], [place], [place] [address]   Culture, respiratory (non-expectorated)     Status: None   Collection Time: 02/19/19  4:56 AM   Specimen: Tracheal Aspirate; Respiratory  Result  Value Ref Range Status   Specimen Description TRACHEAL ASPIRATE  Final   Special Requests NONE  Final   Gram Stain   Final    FEW WBC PRESENT, PREDOMINANTLY PMN FEW GRAM NEGATIVE RODS Performed at [facility], [address], [place], [place] [address]    Culture MODERATE PSEUDOMONAS AERUGINOSA  Final   Report Status 02/21/2019 FINAL  Final   Organism ID, Bacteria PSEUDOMONAS AERUGINOSA  Final      Susceptibility   Pseudomonas aeruginosa - MIC*    CEFTAZIDIME 4 SENSITIVE Sensitive     CIPROFLOXACIN >=4 RESISTANT Resistant     GENTAMICIN 8 INTERMEDIATE Intermediate     IMIPENEM >=16 RESISTANT Resistant     PIP/TAZO 64 SENSITIVE Sensitive     CEFEPIME 16 INTERMEDIATE Intermediate     * MODERATE PSEUDOMONAS AERUGINOSA   Discharge Instructions: Discharge Instructions    Call MD for:  redness, tenderness, or signs of infection (pain, swelling, redness, odor or green/yellow discharge around incision site)   Complete by: As directed    Call MD for:  temperature >100.4   Complete by: As directed    Diet - low sodium heart healthy   Complete by: As directed    Discharge instructions   Complete by: As directed    Thank you for choosing [facility] for your medical care. You were admitted for respiratory failure, hypotension, and hypothermia. While in our care, you were stabilized. Please take your medications as prescribed. If you  notice shortness of breath, excessive sweating, headaches, nausea, or worsening of your symptoms, please come back for a reevaluation.   Increase activity slowly   Complete by: As directed     Signed: Ina Homes, MD 02/21/2019, 11:50 AM

## 2019-02-21 NOTE — TOC Progression Note (Addendum)
Transition of Care [facility]) - Progression Note    Patient Details  Name: Alexander Scott MRN: [id number] Date of Birth: 21-Dec-1950  [facility]) CM/SW Contact  Bartholomew Crews, RN Phone Number: [phone] 02/21/2019, 12:57 PM  Clinical Narrative:    Unable to reach patient's son, Alexander Scott. No response to voice mail. Telephone call to Alexander Scott, patient's mother, who is in agreement with patient transition to [facility]. Discussed paperwork to be signed - requested that her daughter (patient's sister) be contacted - Alexander Scott [phone]. Discussed with [name] at [facility] who will reach out for paperwork.   Received message from [place] at [facility]. Sister agrees to transition to [facility]. Pending bed assignment.   Expected Discharge Plan: Long Term Acute Care (LTAC) Barriers to Discharge: Continued Medical Work up  Expected Discharge Plan and Services Expected Discharge Plan: [place] (LTAC)   Discharge Planning Services: CM Consult Post Acute Care Choice: Long Term Acute Care (LTAC) Living arrangements for the past 2 months: [place] Expected Discharge Date: 02/21/19               DME Arranged: N/A DME Agency: NA       HH Arranged: NA HH Agency: NA         Social Determinants of Health (SDOH) Interventions    Readmission Risk Interventions No flowsheet data found.

## 2019-02-21 NOTE — TOC Initial Note (Signed)
Transition of Care [facility]) - Initial/Assessment Note    Patient Details  Name: Alexander Scott MRN: [id number] Date of Birth: February 02, 1951  [facility]) CM/SW Contact:    Bartholomew Crews, RN Phone Number: [phone] 02/21/2019, 10:26 AM  Clinical Narrative:                 Spoke with [name] at [facility]. They can accdpt patient today. PTA patient was at [facility] and will return to SNF from [facility] when ready. MD paged - pending call back.   Expected Discharge Plan: Long Term Acute Care (LTAC) Barriers to Discharge: Continued Medical Work up   Patient Goals and CMS Choice        Expected Discharge Plan and Services Expected Discharge Plan: Long Term Acute Care (LTAC)   Discharge Planning Services: CM Consult Post Acute Care Choice: Long Term Acute Care (LTAC) Living arrangements for the past 2 months: [place]                 DME Arranged: N/A DME Agency: NA       HH Arranged: NA HH Agency: NA        Prior Living Arrangements/Services Living arrangements for the past 2 months: [place]                  Criminal Activity/Legal Involvement Pertinent to Current Situation/Hospitalization: No - Comment as needed  Activities of Daily Living      Permission Sought/Granted                  Emotional Assessment         Alcohol / Substance Use: Not Applicable Psych Involvement: No (comment)  Admission diagnosis:  Respiratory failure ([place]) [J96.90] VAP (ventilator-associated pneumonia) ([place]) [id number] Urinary tract infection without hematuria, site unspecified [N39.0] Patient Active Problem List   Diagnosis Date Noted  . Pressure ulcer of sacral region, stage 3 ([place]) 02/19/2019  . Hypercalcemia 02/19/2019  . VAP (ventilator-associated pneumonia) ([place])   . Goals of care, counseling/discussion   . Palliative care by specialist   . Urinary tract infection without hematuria   . Sepsis ([place]) 02/18/2019  .  Ventilator dependence ([place])   . DNR (do not resuscitate)   . Chronic hypercapnic respiratory failure ([place])   . Paraplegia ([place])   . Cardiac arrest ([place])   . S/P placement of cardiac pacemaker   . Acute on chronic respiratory failure with hypoxia ([place])   . Cervical spinal cord injury, sequela ([place])   . 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 Polysubstance abuse ([place])   . Pleural effusion associated with hepatic disorder    PCP:  Deanne Coffer, MD Pharmacy:  No Pharmacies Listed    Social Determinants of Health (SDOH) Interventions    Readmission Risk Interventions No flowsheet data found.

## 2019-02-21 NOTE — Progress Notes (Signed)
Pt transported back to [facility] via [facility]. VSS.

## 2019-02-21 NOTE — Progress Notes (Signed)
Daily Progress Note   Patient Name: Alexander Scott       Date: 02/21/2019 DOB: 08/16/1950  Age: 68 y.o. MRN#: [id number] Attending Physician: Alexander Groves, DO Primary Care Physician: Alexander Coffer, MD Admit Date: 02/18/2019  Reason for Consultation/Follow-up: Establishing goals of care  Subjective: No changes. No call back from son.  Length of Stay: 3  Current Medications: Scheduled Meds:  . albuterol  10 mg Nebulization Once  . chlorhexidine gluconate (MEDLINE KIT)  15 mL Mouth Rinse BID  . docusate  100 mg Oral Daily  . feeding supplement (PRO-STAT SUGAR FREE 64)  30 mL Per Tube Daily  . ferrous sulfate  325 mg Per Tube Daily  . heparin injection (subcutaneous)  5,000 Units Subcutaneous Q8H  . insulin aspart  0-15 Units Subcutaneous TID WC  . mouth rinse  15 mL Mouth Rinse 10 times per day  . nutrition supplement (JUVEN)  1 packet Per Tube Q12H  . pantoprazole sodium  40 mg Per Tube Daily  . sodium chloride flush  10-40 mL Intracatheter Q12H  . sterile water for irrigation   Irrigation Q4H    Continuous Infusions: . feeding supplement (VITAL AF 1.2 CAL) 1,000 mL (02/21/19 0550)    PRN Meds: acetaminophen **OR** acetaminophen, clonazePAM, Digestive Enzyme, fentaNYL (SUBLIMAZE) injection, promethazine, senna-docusate, sodium chloride flush       Vital Signs: BP (!) 148/100   Pulse 87   Temp 98.6 F (37 C) (Axillary)   Resp 20   Ht 5' 11"  (1.803 m)   Wt 65.2 kg   SpO2 100%   BMI 20.05 kg/m  SpO2: SpO2: 100 % O2 Device: O2 Device: Ventilator O2 Flow Rate:    Intake/output summary:   Intake/Output Summary (Last 24 hours) at 02/21/2019 1252 Last data filed at 02/21/2019 1000 Gross per 24 hour  Intake 2545 ml  Output 3120 ml  Net -575 ml   LBM: Last BM Date: 02/21/19  Baseline Weight: Weight: 62.9 kg Most recent weight: Weight: 65.2 kg       Palliative Assessment/Data:PPS 30%    Flowsheet Rows     Most Recent Value  Intake Tab  Referral Department  -- [IMTS]  Unit at Time of Referral  Intermediate Care Unit  Palliative Care Primary Diagnosis  Sepsis/Infectious Disease  Date Notified  02/18/19  Palliative Care Type  New Palliative care  Reason for referral  Clarify Goals of Care  Date of Admission  02/18/19  Date first seen by Palliative Care  02/19/19  # of days Palliative referral response time  1 Day(s)  # of days IP prior to Palliative referral  0  Clinical Assessment  Palliative Performance Scale Score  30%  Psychosocial & Spiritual Assessment  Palliative Care Outcomes  Patient/Family meeting held?  Yes  Who was at the meeting?  son  Palliative Care Outcomes  Clarified goals of care, Provided end of life care assistance, Provided advance care planning, Provided psychosocial or spiritual support      Patient Active Problem List   Diagnosis Date Noted  . Pressure ulcer of sacral region, stage 3 ([place]) 02/19/2019  . Hypercalcemia 02/19/2019  . VAP (ventilator-associated pneumonia) ([place])   . Goals  of care, counseling/discussion   . Palliative care by specialist   . Urinary tract infection without hematuria   . Sepsis ([place]) 02/18/2019  . Ventilator dependence ([place])   . DNR (do not resuscitate)   . Chronic hypercapnic respiratory failure ([place])   . Paraplegia ([place])   . Cardiac arrest ([place])   . S/P placement of cardiac pacemaker   . Acute on chronic respiratory failure with hypoxia ([place])   . Cervical spinal cord injury, sequela ([place])   . Subdural hematoma, post-traumatic ([place])   . Polysubstance abuse ([place])   . Pleural effusion associated with hepatic disorder     Palliative Care Assessment & Plan   HPI: 68 y.o. male  with past medical history of cervical spine injury s/p trach and peg, traumatic paraplegia secondary to fall from  porch May 2020, cardiac arrest, anoxic brain injury, substance use disorder, SDH, and chronic respiratory failure secondary to muscle weakness and increased secretions admitted on 02/18/2019 with hypotension.  Found to have temp of 90.5 and bair hugger initiated. CXR reveals possible pneumonia. Being treated for sepsis. PMT consulted for [place].   Assessment: Called patient's son Alexander Scott for two days with no answer or return call. Voicemails left with callback number.  Patient getting close to discharge. Called and spoke with Alexander Scott representative, Alexander Scott. Discussed plan of care with Alexander Scott and initial conversation with son - regarding possibility of ventilator withdrawal. Alexander Scott shares that Alexander Scott has a Data processing manager who can continue these goals of care conversations.   Discussed with Alexander Scott with IMTS.   Recommendations/Plan:  Patient to return to Alexander Scott today - palliative practitioner at [facility] will continue goals of care with family as we have been unable to reach following initial [place] conversations  Per previous conversation with son: DNR, focus on comfort, no pressors if needed  Code Status:  DNR  Prognosis:   Unable to determine  Discharge Planning:  Alexander Scott  Care plan was discussed with Alexander Scott and Alexander Scott representative  Thank you for allowing the Palliative Medicine Team to assist in the care of this patient.   Total Time 25 minutes Prolonged Time Billed  no    The above conversation was completed via telephone due to the visitor restrictions during the COVID-19 pandemic. Thorough chart review and discussion with necessary members of the care team was completed as part of assessment. All issues were discussed and addressed but no physical exam was performed.    Greater than 50%  of this time was spent counseling and coordinating care related to the above assessment and plan.  Alexander Burrow, DNP, [facility] Palliative Medicine Team Team Phone # [phone]   Pager [phone]

## 2019-02-21 NOTE — TOC Progression Note (Signed)
Transition of Care [facility]) - Progression Note    Patient Details  Name: Alexander Scott MRN: [id number] Date of Birth: [birth year]  Transition of Care [facility]) CM/SW Contact  Bartholomew Crews, RN Phone Number: [phone] 02/21/2019, 11:32 AM  Clinical Narrative:    Damaris Schooner with MD. Patient will transition to [facility] today pending a visit from palliative.    Expected Discharge Plan: Long Term Acute Care (LTAC) Barriers to Discharge: Continued Medical Work up  Expected Discharge Plan and Services Expected Discharge Plan: [place] (LTAC)   Discharge Planning Services: CM Consult Post Acute Care Choice: Long Term Acute Care (LTAC) Living arrangements for the past 2 months: [place]                 DME Arranged: N/A DME Agency: NA       HH Arranged: NA HH Agency: NA         Social Determinants of Health (SDOH) Interventions    Readmission Risk Interventions No flowsheet data found.

## 2019-02-23 DIAGNOSIS — J95851 Ventilator associated pneumonia: Secondary | ICD-10-CM

## 2019-02-23 DIAGNOSIS — I469 Cardiac arrest, cause unspecified: Secondary | ICD-10-CM

## 2019-02-23 DIAGNOSIS — G931 Anoxic brain damage, not elsewhere classified: Secondary | ICD-10-CM

## 2019-02-23 DIAGNOSIS — G8250 Quadriplegia, unspecified: Secondary | ICD-10-CM

## 2019-02-23 DIAGNOSIS — C9001 Multiple myeloma in remission: Secondary | ICD-10-CM

## 2019-02-23 DIAGNOSIS — J9621 Acute and chronic respiratory failure with hypoxia: Secondary | ICD-10-CM

## 2019-02-23 LAB — CULTURE, BLOOD (ROUTINE X 2)
Culture: NO GROWTH
Culture: NO GROWTH

## 2019-03-02 LAB — PTH-RELATED PEPTIDE: PTH-related peptide: <2 pmol/L

## 2019-03-03 DIAGNOSIS — I469 Cardiac arrest, cause unspecified: Secondary | ICD-10-CM

## 2019-03-03 DIAGNOSIS — J9621 Acute and chronic respiratory failure with hypoxia: Secondary | ICD-10-CM

## 2019-03-03 DIAGNOSIS — G8250 Quadriplegia, unspecified: Secondary | ICD-10-CM

## 2019-03-03 DIAGNOSIS — C9001 Multiple myeloma in remission: Secondary | ICD-10-CM

## 2019-03-03 DIAGNOSIS — G931 Anoxic brain damage, not elsewhere classified: Secondary | ICD-10-CM

## 2019-03-03 DIAGNOSIS — J95851 Ventilator associated pneumonia: Secondary | ICD-10-CM

## 2019-03-04 DIAGNOSIS — C9001 Multiple myeloma in remission: Secondary | ICD-10-CM

## 2019-03-04 DIAGNOSIS — J95851 Ventilator associated pneumonia: Secondary | ICD-10-CM

## 2019-03-04 DIAGNOSIS — G931 Anoxic brain damage, not elsewhere classified: Secondary | ICD-10-CM

## 2019-03-04 DIAGNOSIS — J9621 Acute and chronic respiratory failure with hypoxia: Secondary | ICD-10-CM

## 2019-03-04 DIAGNOSIS — I469 Cardiac arrest, cause unspecified: Secondary | ICD-10-CM

## 2019-03-04 DIAGNOSIS — G8250 Quadriplegia, unspecified: Secondary | ICD-10-CM

## 2019-03-05 DIAGNOSIS — J9621 Acute and chronic respiratory failure with hypoxia: Secondary | ICD-10-CM

## 2019-03-05 DIAGNOSIS — G931 Anoxic brain damage, not elsewhere classified: Secondary | ICD-10-CM

## 2019-03-05 DIAGNOSIS — I469 Cardiac arrest, cause unspecified: Secondary | ICD-10-CM

## 2019-03-05 DIAGNOSIS — C9001 Multiple myeloma in remission: Secondary | ICD-10-CM

## 2019-03-05 DIAGNOSIS — G8250 Quadriplegia, unspecified: Secondary | ICD-10-CM

## 2019-03-05 DIAGNOSIS — J95851 Ventilator associated pneumonia: Secondary | ICD-10-CM

## 2019-03-06 DIAGNOSIS — J9621 Acute and chronic respiratory failure with hypoxia: Secondary | ICD-10-CM

## 2019-03-06 DIAGNOSIS — C9001 Multiple myeloma in remission: Secondary | ICD-10-CM

## 2019-03-06 DIAGNOSIS — I469 Cardiac arrest, cause unspecified: Secondary | ICD-10-CM

## 2019-03-06 DIAGNOSIS — G8250 Quadriplegia, unspecified: Secondary | ICD-10-CM

## 2019-03-06 DIAGNOSIS — J95851 Ventilator associated pneumonia: Secondary | ICD-10-CM

## 2019-03-06 DIAGNOSIS — G931 Anoxic brain damage, not elsewhere classified: Secondary | ICD-10-CM

## 2019-03-07 DIAGNOSIS — I469 Cardiac arrest, cause unspecified: Secondary | ICD-10-CM

## 2019-03-07 DIAGNOSIS — G8250 Quadriplegia, unspecified: Secondary | ICD-10-CM

## 2019-03-07 DIAGNOSIS — J9621 Acute and chronic respiratory failure with hypoxia: Secondary | ICD-10-CM

## 2019-03-07 DIAGNOSIS — J95851 Ventilator associated pneumonia: Secondary | ICD-10-CM

## 2019-03-07 DIAGNOSIS — C9001 Multiple myeloma in remission: Secondary | ICD-10-CM

## 2019-03-07 DIAGNOSIS — G931 Anoxic brain damage, not elsewhere classified: Secondary | ICD-10-CM

## 2019-03-08 DIAGNOSIS — G8250 Quadriplegia, unspecified: Secondary | ICD-10-CM

## 2019-03-08 DIAGNOSIS — C9001 Multiple myeloma in remission: Secondary | ICD-10-CM

## 2019-03-08 DIAGNOSIS — I469 Cardiac arrest, cause unspecified: Secondary | ICD-10-CM

## 2019-03-08 DIAGNOSIS — G931 Anoxic brain damage, not elsewhere classified: Secondary | ICD-10-CM

## 2019-03-08 DIAGNOSIS — J95851 Ventilator associated pneumonia: Secondary | ICD-10-CM

## 2019-03-08 DIAGNOSIS — J9621 Acute and chronic respiratory failure with hypoxia: Secondary | ICD-10-CM

## 2019-03-09 DIAGNOSIS — J9621 Acute and chronic respiratory failure with hypoxia: Secondary | ICD-10-CM

## 2019-03-09 DIAGNOSIS — G8250 Quadriplegia, unspecified: Secondary | ICD-10-CM

## 2019-03-09 DIAGNOSIS — G931 Anoxic brain damage, not elsewhere classified: Secondary | ICD-10-CM

## 2019-03-09 DIAGNOSIS — J95851 Ventilator associated pneumonia: Secondary | ICD-10-CM

## 2019-03-09 DIAGNOSIS — C9001 Multiple myeloma in remission: Secondary | ICD-10-CM

## 2019-03-09 DIAGNOSIS — I469 Cardiac arrest, cause unspecified: Secondary | ICD-10-CM

## 2019-04-01 DIAGNOSIS — E875 Hyperkalemia: Secondary | ICD-10-CM

## 2019-04-19 ENCOUNTER — Emergency Department (HOSPITAL_COMMUNITY): Payer: Medicare Other

## 2019-04-19 ENCOUNTER — Inpatient Hospital Stay (HOSPITAL_COMMUNITY)
Admission: EM | Admit: 2019-04-19 | Discharge: 2019-04-22 | DRG: 871 | Disposition: A | Discharge status: Skilled Nursing Facility | Payer: Medicare Other | Attending: Pulmonary Disease | Admitting: Pulmonary Disease

## 2019-04-19 ENCOUNTER — Inpatient Hospital Stay (HOSPITAL_COMMUNITY): Payer: Medicare Other

## 2019-04-19 ENCOUNTER — Other Ambulatory Visit: Payer: Self-pay

## 2019-04-19 ENCOUNTER — Encounter (HOSPITAL_COMMUNITY): Payer: Self-pay | Admitting: Emergency Medicine

## 2019-04-19 DIAGNOSIS — S065X9A Traumatic subdural hemorrhage with loss of consciousness of unspecified duration, initial encounter: Secondary | ICD-10-CM | POA: Diagnosis present

## 2019-04-19 DIAGNOSIS — X58XXXA Exposure to other specified factors, initial encounter: Secondary | ICD-10-CM | POA: Diagnosis present

## 2019-04-19 DIAGNOSIS — Y848 Other medical procedures as the cause of abnormal reaction of the patient, or of later complication, without mention of misadventure at the time of the procedure: Secondary | ICD-10-CM | POA: Diagnosis present

## 2019-04-19 DIAGNOSIS — A419 Sepsis, unspecified organism: Secondary | ICD-10-CM | POA: Diagnosis not present

## 2019-04-19 DIAGNOSIS — Z79899 Other long term (current) drug therapy: Secondary | ICD-10-CM | POA: Diagnosis not present

## 2019-04-19 DIAGNOSIS — Z8674 Personal history of sudden cardiac arrest: Secondary | ICD-10-CM | POA: Diagnosis not present

## 2019-04-19 DIAGNOSIS — J150 Pneumonia due to Klebsiella pneumoniae: Secondary | ICD-10-CM | POA: Diagnosis present

## 2019-04-19 DIAGNOSIS — R6521 Severe sepsis with septic shock: Secondary | ICD-10-CM | POA: Diagnosis present

## 2019-04-19 DIAGNOSIS — Z981 Arthrodesis status: Secondary | ICD-10-CM

## 2019-04-19 DIAGNOSIS — L89153 Pressure ulcer of sacral region, stage 3: Secondary | ICD-10-CM | POA: Diagnosis present

## 2019-04-19 DIAGNOSIS — J9610 Chronic respiratory failure, unspecified whether with hypoxia or hypercapnia: Secondary | ICD-10-CM | POA: Diagnosis present

## 2019-04-19 DIAGNOSIS — E871 Hypo-osmolality and hyponatremia: Secondary | ICD-10-CM | POA: Diagnosis present

## 2019-04-19 DIAGNOSIS — Z20828 Contact with and (suspected) exposure to other viral communicable diseases: Secondary | ICD-10-CM | POA: Diagnosis present

## 2019-04-19 DIAGNOSIS — N179 Acute kidney failure, unspecified: Secondary | ICD-10-CM | POA: Diagnosis present

## 2019-04-19 DIAGNOSIS — R4182 Altered mental status, unspecified: Secondary | ICD-10-CM | POA: Diagnosis not present

## 2019-04-19 DIAGNOSIS — G8382 Anterior cord syndrome: Secondary | ICD-10-CM | POA: Diagnosis present

## 2019-04-19 DIAGNOSIS — Y95 Nosocomial condition: Secondary | ICD-10-CM | POA: Diagnosis present

## 2019-04-19 DIAGNOSIS — J95851 Ventilator associated pneumonia: Secondary | ICD-10-CM | POA: Diagnosis present

## 2019-04-19 DIAGNOSIS — R7989 Other specified abnormal findings of blood chemistry: Secondary | ICD-10-CM

## 2019-04-19 DIAGNOSIS — Z930 Tracheostomy status: Secondary | ICD-10-CM | POA: Diagnosis not present

## 2019-04-19 DIAGNOSIS — D638 Anemia in other chronic diseases classified elsewhere: Secondary | ICD-10-CM | POA: Diagnosis present

## 2019-04-19 DIAGNOSIS — Z794 Long term (current) use of insulin: Secondary | ICD-10-CM | POA: Diagnosis not present

## 2019-04-19 DIAGNOSIS — G8250 Quadriplegia, unspecified: Secondary | ICD-10-CM | POA: Diagnosis present

## 2019-04-19 DIAGNOSIS — Z950 Presence of cardiac pacemaker: Secondary | ICD-10-CM

## 2019-04-19 DIAGNOSIS — N390 Urinary tract infection, site not specified: Secondary | ICD-10-CM | POA: Diagnosis present

## 2019-04-19 DIAGNOSIS — I959 Hypotension, unspecified: Secondary | ICD-10-CM | POA: Diagnosis present

## 2019-04-19 DIAGNOSIS — A4159 Other Gram-negative sepsis: Secondary | ICD-10-CM | POA: Diagnosis present

## 2019-04-19 DIAGNOSIS — J9600 Acute respiratory failure, unspecified whether with hypoxia or hypercapnia: Secondary | ICD-10-CM

## 2019-04-19 DIAGNOSIS — Z9911 Dependence on respirator [ventilator] status: Secondary | ICD-10-CM | POA: Diagnosis not present

## 2019-04-19 DIAGNOSIS — E875 Hyperkalemia: Secondary | ICD-10-CM | POA: Diagnosis present

## 2019-04-19 LAB — MAGNESIUM
Magnesium: 1.5 mg/dL — ABNORMAL LOW (ref 1.7–2.4)
Magnesium: 1.5 mg/dL — ABNORMAL LOW (ref 1.7–2.4)

## 2019-04-19 LAB — COMPREHENSIVE METABOLIC PANEL
ALT: 53 U/L — ABNORMAL HIGH (ref 0–44)
AST: 41 U/L (ref 15–41)
Albumin: 2.7 g/dL — ABNORMAL LOW (ref 3.5–5.0)
Alkaline Phosphatase: 133 U/L — ABNORMAL HIGH (ref 38–126)
Anion gap: 6 (ref 5–15)
BUN: 74 mg/dL — ABNORMAL HIGH (ref 8–23)
CO2: 19 mmol/L — ABNORMAL LOW (ref 22–32)
Calcium: 10.3 mg/dL (ref 8.9–10.3)
Chloride: 101 mmol/L (ref 98–111)
Creatinine, Ser: 1.2 mg/dL (ref 0.61–1.24)
GFR calc Af Amer: >60 mL/min (ref >60)
GFR calc non Af Amer: >60 mL/min (ref >60)
Glucose, Bld: 328 mg/dL — ABNORMAL HIGH (ref 70–99)
Potassium: >7.5 mmol/L (ref 3.5–5.1)
Sodium: 126 mmol/L — ABNORMAL LOW (ref 135–145)
Total Bilirubin: 0.2 mg/dL — ABNORMAL LOW (ref 0.3–1.2)
Total Protein: 9.1 g/dL — ABNORMAL HIGH (ref 6.5–8.1)

## 2019-04-19 LAB — BASIC METABOLIC PANEL
Anion gap: 7 (ref 5–15)
Anion gap: 5 (ref 5–15)
BUN: 77 mg/dL — ABNORMAL HIGH (ref 8–23)
BUN: 75 mg/dL — ABNORMAL HIGH (ref 8–23)
CO2: 17 mmol/L — ABNORMAL LOW (ref 22–32)
CO2: 19 mmol/L — ABNORMAL LOW (ref 22–32)
Calcium: 10.4 mg/dL — ABNORMAL HIGH (ref 8.9–10.3)
Calcium: 10.2 mg/dL (ref 8.9–10.3)
Chloride: 108 mmol/L (ref 98–111)
Chloride: 111 mmol/L (ref 98–111)
Creatinine, Ser: 1.19 mg/dL (ref 0.61–1.24)
Creatinine, Ser: 1.21 mg/dL (ref 0.61–1.24)
GFR calc Af Amer: >60 mL/min (ref >60)
GFR calc Af Amer: >60 mL/min (ref >60)
GFR calc non Af Amer: >60 mL/min (ref >60)
GFR calc non Af Amer: >60 mL/min (ref >60)
Glucose, Bld: 82 mg/dL (ref 70–99)
Glucose, Bld: 84 mg/dL (ref 70–99)
Potassium: 6.9 mmol/L (ref 3.5–5.1)
Potassium: 6.1 mmol/L — ABNORMAL HIGH (ref 3.5–5.1)
Sodium: 132 mmol/L — ABNORMAL LOW (ref 135–145)
Sodium: 135 mmol/L (ref 135–145)

## 2019-04-19 LAB — GLUCOSE, CAPILLARY
Glucose-Capillary: 73 mg/dL (ref 70–99)
Glucose-Capillary: 66 mg/dL — ABNORMAL LOW (ref 70–99)
Glucose-Capillary: 60 mg/dL — ABNORMAL LOW (ref 70–99)
Glucose-Capillary: 114 mg/dL — ABNORMAL HIGH (ref 70–99)
Glucose-Capillary: 91 mg/dL (ref 70–99)
Glucose-Capillary: 62 mg/dL — ABNORMAL LOW (ref 70–99)
Glucose-Capillary: 81 mg/dL (ref 70–99)

## 2019-04-19 LAB — CBC WITH DIFFERENTIAL/PLATELET
Abs Immature Granulocytes: 0.04 10*3/uL (ref 0.00–0.07)
Basophils Absolute: 0 10*3/uL (ref 0.0–0.1)
Basophils Relative: 0 %
Eosinophils Absolute: 0 10*3/uL (ref 0.0–0.5)
Eosinophils Relative: 0 %
HCT: 31.2 % — ABNORMAL LOW (ref 39.0–52.0)
Hemoglobin: 9.3 g/dL — ABNORMAL LOW (ref 13.0–17.0)
Immature Granulocytes: 0 %
Lymphocytes Relative: 8 %
Lymphs Abs: 0.9 10*3/uL (ref 0.7–4.0)
MCH: 28.5 pg (ref 26.0–34.0)
MCHC: 29.8 g/dL — ABNORMAL LOW (ref 30.0–36.0)
MCV: 95.7 fL (ref 80.0–100.0)
Monocytes Absolute: 0.5 10*3/uL (ref 0.1–1.0)
Monocytes Relative: 4 %
Neutro Abs: 9.2 10*3/uL — ABNORMAL HIGH (ref 1.7–7.7)
Neutrophils Relative %: 88 %
Platelets: 352 10*3/uL (ref 150–400)
RBC: 3.26 MIL/uL — ABNORMAL LOW (ref 4.22–5.81)
RDW: 18.6 % — ABNORMAL HIGH (ref 11.5–15.5)
WBC: 10.6 10*3/uL — ABNORMAL HIGH (ref 4.0–10.5)
nRBC: 0 % (ref 0.0–0.2)

## 2019-04-19 LAB — URINALYSIS, ROUTINE W REFLEX MICROSCOPIC
Bilirubin Urine: NEGATIVE
Glucose, UA: NEGATIVE mg/dL
Ketones, ur: NEGATIVE mg/dL
Nitrite: NEGATIVE
Protein, ur: 100 mg/dL — AB
Specific Gravity, Urine: >1.03 — ABNORMAL HIGH (ref 1.005–1.030)
pH: 5 (ref 5.0–8.0)

## 2019-04-19 LAB — I-STAT CHEM 8, ED
BUN: 79 mg/dL — ABNORMAL HIGH (ref 8–23)
Calcium, Ion: 1.55 mmol/L (ref 1.15–1.40)
Chloride: 110 mmol/L (ref 98–111)
Creatinine, Ser: 1.2 mg/dL (ref 0.61–1.24)
Glucose, Bld: 264 mg/dL — ABNORMAL HIGH (ref 70–99)
HCT: 38 % — ABNORMAL LOW (ref 39.0–52.0)
Hemoglobin: 12.9 g/dL — ABNORMAL LOW (ref 13.0–17.0)
Potassium: 7.8 mmol/L (ref 3.5–5.1)
Sodium: 133 mmol/L — ABNORMAL LOW (ref 135–145)
TCO2: 20 mmol/L — ABNORMAL LOW (ref 22–32)

## 2019-04-19 LAB — POCT I-STAT EG7
Acid-base deficit: 11 mmol/L — ABNORMAL HIGH (ref 0.0–2.0)
Bicarbonate: 17.9 mmol/L — ABNORMAL LOW (ref 20.0–28.0)
Calcium, Ion: 1.51 mmol/L (ref 1.15–1.40)
HCT: 32 % — ABNORMAL LOW (ref 39.0–52.0)
Hemoglobin: 10.9 g/dL — ABNORMAL LOW (ref 13.0–17.0)
O2 Saturation: 88 %
Potassium: >8.5 mmol/L (ref 3.5–5.1)
Sodium: 130 mmol/L — ABNORMAL LOW (ref 135–145)
TCO2: 19 mmol/L — ABNORMAL LOW (ref 22–32)
pCO2, Ven: 53.2 mmHg (ref 44.0–60.0)
pH, Ven: 7.134 — CL (ref 7.250–7.430)
pO2, Ven: 73 mmHg — ABNORMAL HIGH (ref 32.0–45.0)

## 2019-04-19 LAB — LACTIC ACID, PLASMA
Lactic Acid, Venous: 1.7 mmol/L (ref 0.5–1.9)
Lactic Acid, Venous: 1.3 mmol/L (ref 0.5–1.9)

## 2019-04-19 LAB — TSH: TSH: 3.152 u[IU]/mL (ref 0.350–4.500)

## 2019-04-19 LAB — SARS CORONAVIRUS 2 BY RT PCR (HOSPITAL ORDER, PERFORMED IN ~~LOC~~ HOSPITAL LAB): SARS Coronavirus 2: NEGATIVE

## 2019-04-19 LAB — PROTIME-INR
INR: 1.2 (ref 0.8–1.2)
Prothrombin Time: 14.8 seconds (ref 11.4–15.2)

## 2019-04-19 LAB — CBG MONITORING, ED: Glucose-Capillary: 254 mg/dL — ABNORMAL HIGH (ref 70–99)

## 2019-04-19 LAB — PHOSPHORUS
Phosphorus: 2.6 mg/dL (ref 2.5–4.6)
Phosphorus: 2.3 mg/dL — ABNORMAL LOW (ref 2.5–4.6)

## 2019-04-19 LAB — URINALYSIS, MICROSCOPIC (REFLEX)

## 2019-04-19 LAB — MRSA PCR SCREENING: MRSA by PCR: NEGATIVE

## 2019-04-19 LAB — TROPONIN I (HIGH SENSITIVITY)
Troponin I (High Sensitivity): 13 ng/L (ref <18)
Troponin I (High Sensitivity): 22 ng/L — ABNORMAL HIGH (ref <18)

## 2019-04-19 LAB — CK: Total CK: 64 U/L (ref 49–397)

## 2019-04-19 MED ORDER — VITAL HIGH PROTEIN PO LIQD
1000.0000 mL | ORAL | Status: DC
  Administered 2019-04-19: 1000 mL

## 2019-04-19 MED ORDER — INSULIN ASPART 100 UNIT/ML ~~LOC~~ SOLN
0.0000 [IU] | SUBCUTANEOUS | Status: DC
  Administered 2019-04-20 – 2019-04-21 (×6): 1 [IU] via SUBCUTANEOUS

## 2019-04-19 MED ORDER — PRO-STAT SUGAR FREE PO LIQD: 30.0000 mL | Freq: Two times a day (BID) | ORAL | Status: DC

## 2019-04-19 MED ORDER — SODIUM CHLORIDE 0.9 % IV BOLUS (SEPSIS)
1000.0000 mL | Freq: Once | INTRAVENOUS | Status: AC
  Administered 2019-04-19: 1000 mL via INTRAVENOUS

## 2019-04-19 MED ORDER — SODIUM ZIRCONIUM CYCLOSILICATE 10 G PO PACK
10.0000 g | PACK | Freq: Three times a day (TID) | ORAL | Status: AC
  Administered 2019-04-19 (×3): 10 g via ORAL
  Filled 2019-04-19 (×3): qty 1

## 2019-04-19 MED ORDER — JUVEN PO PACK
1.0000 | PACK | Freq: Two times a day (BID) | ORAL | Status: DC
  Administered 2019-04-19 – 2019-04-22 (×7): 1
  Filled 2019-04-19 (×8): qty 1

## 2019-04-19 MED ORDER — DEXTROSE 50 % IV SOLN
INTRAVENOUS | Status: AC
  Filled 2019-04-19: qty 50

## 2019-04-19 MED ORDER — LORAZEPAM 2 MG/ML IJ SOLN
2.0000 mg | Freq: Once | INTRAMUSCULAR | Status: AC
  Administered 2019-04-19: 2 mg via INTRAVENOUS

## 2019-04-19 MED ORDER — LACTATED RINGERS IV SOLN
INTRAVENOUS | Status: DC
  Administered 2019-04-19 (×2): via INTRAVENOUS

## 2019-04-19 MED ORDER — VITAL AF 1.2 CAL PO LIQD
1000.0000 mL | ORAL | Status: DC
  Administered 2019-04-20 – 2019-04-22 (×3): 1000 mL

## 2019-04-19 MED ORDER — ALBUTEROL SULFATE (2.5 MG/3ML) 0.083% IN NEBU
10.0000 mg | INHALATION_SOLUTION | Freq: Once | RESPIRATORY_TRACT | Status: AC
  Administered 2019-04-19: 10 mg via RESPIRATORY_TRACT
  Filled 2019-04-19: qty 12

## 2019-04-19 MED ORDER — HEPARIN SODIUM (PORCINE) 5000 UNIT/ML IJ SOLN
5000.0000 [IU] | Freq: Three times a day (TID) | INTRAMUSCULAR | Status: DC
  Administered 2019-04-19: 5000 [IU] via SUBCUTANEOUS
  Filled 2019-04-19: qty 1

## 2019-04-19 MED ORDER — SODIUM CHLORIDE 0.9 % IV SOLN
2.0000 g | Freq: Three times a day (TID) | INTRAVENOUS | Status: DC
  Filled 2019-04-19 (×2): qty 2

## 2019-04-19 MED ORDER — IOHEXOL 350 MG/ML SOLN
100.0000 mL | Freq: Once | INTRAVENOUS | Status: AC | PRN
  Administered 2019-04-19: 100 mL via INTRAVENOUS

## 2019-04-19 MED ORDER — PANTOPRAZOLE SODIUM 40 MG IV SOLR
40.0000 mg | Freq: Every day | INTRAVENOUS | Status: DC
  Administered 2019-04-19 – 2019-04-22 (×4): 40 mg via INTRAVENOUS
  Filled 2019-04-19 (×5): qty 40

## 2019-04-19 MED ORDER — SODIUM BICARBONATE 8.4 % IV SOLN
50.0000 meq | Freq: Once | INTRAVENOUS | Status: AC
  Administered 2019-04-19: 50 meq via INTRAVENOUS
  Filled 2019-04-19: qty 50

## 2019-04-19 MED ORDER — VANCOMYCIN HCL 10 G IV SOLR: 1500.0000 mg | INTRAVENOUS | Status: DC

## 2019-04-19 MED ORDER — VANCOMYCIN VARIABLE DOSE PER UNSTABLE RENAL FUNCTION (PHARMACIST DOSING): Status: DC

## 2019-04-19 MED ORDER — IPRATROPIUM-ALBUTEROL 0.5-2.5 (3) MG/3ML IN SOLN: 3.0000 mL | RESPIRATORY_TRACT | Status: DC | PRN

## 2019-04-19 MED ORDER — NOREPINEPHRINE 4 MG/250ML-% IV SOLN
0.0000 ug/min | INTRAVENOUS | Status: DC
  Administered 2019-04-19: 5 ug/min via INTRAVENOUS

## 2019-04-19 MED ORDER — LORAZEPAM 2 MG/ML IJ SOLN: 2.0000 mg | INTRAMUSCULAR | Status: DC | PRN

## 2019-04-19 MED ORDER — CHLORHEXIDINE GLUCONATE 0.12% ORAL RINSE (MEDLINE KIT)
15.0000 mL | Freq: Two times a day (BID) | OROMUCOSAL | Status: DC
  Administered 2019-04-19 – 2019-04-22 (×7): 15 mL via OROMUCOSAL

## 2019-04-19 MED ORDER — VANCOMYCIN HCL IN DEXTROSE 1-5 GM/200ML-% IV SOLN: 1000.0000 mg | Freq: Once | INTRAVENOUS | Status: DC

## 2019-04-19 MED ORDER — SODIUM CHLORIDE 0.9 % IV SOLN
2.0000 g | Freq: Once | INTRAVENOUS | Status: AC
  Administered 2019-04-19: 2 g via INTRAVENOUS
  Filled 2019-04-19: qty 2

## 2019-04-19 MED ORDER — CLONAZEPAM 0.5 MG PO TBDP
0.5000 mg | ORAL_TABLET | Freq: Every day | ORAL | Status: DC
  Administered 2019-04-19 – 2019-04-21 (×3): 0.5 mg via ORAL
  Filled 2019-04-19 (×3): qty 1

## 2019-04-19 MED ORDER — SODIUM CHLORIDE 0.9 % IV BOLUS (SEPSIS)
500.0000 mL | Freq: Once | INTRAVENOUS | Status: AC
  Administered 2019-04-19: 500 mL via INTRAVENOUS

## 2019-04-19 MED ORDER — INSULIN ASPART 100 UNIT/ML IV SOLN
10.0000 [IU] | Freq: Once | INTRAVENOUS | Status: AC
  Administered 2019-04-19: 10 [IU] via INTRAVENOUS

## 2019-04-19 MED ORDER — VANCOMYCIN HCL 10 G IV SOLR
1500.0000 mg | Freq: Once | INTRAVENOUS | Status: AC
  Administered 2019-04-19: 1500 mg via INTRAVENOUS
  Filled 2019-04-19: qty 1500

## 2019-04-19 MED ORDER — SODIUM CHLORIDE 0.9 % IV SOLN
INTRAVENOUS | Status: DC
  Administered 2019-04-19: 18:00:00 via INTRAVENOUS

## 2019-04-19 MED ORDER — SODIUM ZIRCONIUM CYCLOSILICATE 10 G PO PACK
10.0000 g | PACK | Freq: Once | ORAL | Status: AC
  Administered 2019-04-19: 10 g via ORAL
  Filled 2019-04-19: qty 1

## 2019-04-19 MED ORDER — LEVOFLOXACIN IN D5W 750 MG/150ML IV SOLN
750.0000 mg | Freq: Once | INTRAVENOUS | Status: DC
  Administered 2019-04-19: 750 mg via INTRAVENOUS
  Filled 2019-04-19: qty 150

## 2019-04-19 MED ORDER — DEXTROSE 50 % IV SOLN
INTRAVENOUS | Status: AC
  Administered 2019-04-19: 25 mL
  Filled 2019-04-19: qty 50

## 2019-04-19 MED ORDER — SODIUM CHLORIDE 0.9 % IV SOLN
1.0000 g | Freq: Once | INTRAVENOUS | Status: AC
  Administered 2019-04-19: 1 g via INTRAVENOUS
  Filled 2019-04-19: qty 10

## 2019-04-19 MED ORDER — CHLORHEXIDINE GLUCONATE CLOTH 2 % EX PADS
6.0000 | MEDICATED_PAD | Freq: Every day | CUTANEOUS | Status: DC
  Administered 2019-04-19 – 2019-04-22 (×2): 6 via TOPICAL

## 2019-04-19 MED ORDER — LACTATED RINGERS IV BOLUS
1000.0000 mL | Freq: Once | INTRAVENOUS | Status: AC
  Administered 2019-04-19: 1000 mL via INTRAVENOUS

## 2019-04-19 MED ORDER — LORAZEPAM 2 MG/ML IJ SOLN
INTRAMUSCULAR | Status: AC
  Filled 2019-04-19: qty 1

## 2019-04-19 MED ORDER — SODIUM CHLORIDE 0.9 % IV SOLN
2.0000 g | Freq: Two times a day (BID) | INTRAVENOUS | Status: DC
  Administered 2019-04-19 – 2019-04-20 (×2): 2 g via INTRAVENOUS
  Filled 2019-04-19 (×3): qty 2

## 2019-04-19 MED ORDER — DEXTROSE 50 % IV SOLN
25.0000 mL | Freq: Once | INTRAVENOUS | Status: AC
  Administered 2019-04-19: 25 mL via INTRAVENOUS

## 2019-04-19 MED ORDER — FENTANYL CITRATE (PF) 100 MCG/2ML IJ SOLN
25.0000 ug | INTRAMUSCULAR | Status: DC | PRN
  Administered 2019-04-19 – 2019-04-21 (×7): 25 ug via INTRAVENOUS
  Filled 2019-04-19 (×7): qty 2

## 2019-04-19 MED ORDER — SODIUM BICARBONATE-DEXTROSE 150-5 MEQ/L-% IV SOLN
150.0000 meq | INTRAVENOUS | Status: AC
  Administered 2019-04-19 – 2019-04-20 (×2): 150 meq via INTRAVENOUS
  Filled 2019-04-19 (×2): qty 1000

## 2019-04-19 MED ORDER — ORAL CARE MOUTH RINSE
15.0000 mL | OROMUCOSAL | Status: DC
  Administered 2019-04-19 – 2019-04-22 (×32): 15 mL via OROMUCOSAL

## 2019-04-19 NOTE — ED Notes (Signed)
NS saline bolus infusing , IV sites intact , Levophed drip infusing at 10 mcg/min , Foley catheter intact / Rectal tube intact ( drained by RN ) , G-tube intact , ventilator continues via tracheostomy, Bair Hugger applied.

## 2019-04-19 NOTE — Progress Notes (Addendum)
PCCM progress note  CT head reviewed with 3 mm SDH.  I have paged neurosurgery to review scan but I suspect no intervention will be needed. CT chest, abd, pelvis shows multifocal PNA, RUL nodular density, cholelithiasis with mimimal pericholecystic fluod.   Pt currently off presors. K still high at 7.8 Change LR to NS at 150/hr to reduce K load EEG pending   Continue antibiotics. Will need follow up CT to ensure nodular density is improving.  Start lokelma. Repeat BMET in evening. Order RUQ [place].  Marshell Garfinkel MD [facility] 04/19/2019, 3:30 PM

## 2019-04-19 NOTE — ED Triage Notes (Signed)
Patient arrived with EMS from [facility] , staff reported increasing respiratory distress/SOB today with hypotension (50/30) , he has a tracheostomy/ventilator , received NS 700 ml IV and Epinephrine drip by EMS . Levophed drip started at arrival .

## 2019-04-19 NOTE — Progress Notes (Signed)
Patient transported from ED to room [id number] without complications.

## 2019-04-19 NOTE — Progress Notes (Signed)
Pharmacy Antibiotic Note  Alexander Scott is a 68 y.o. male admitted on 04/19/2019 with circulatory failure, sepsis, shock, and metabolic crisis.  Pharmacy has been consulted for vancomycin and aztreonam dosing.  Plan: Vancomycin 1500mg  IV Q24H. Goal AUC 400-550.  Expected AUC 520.  SCr used 1.2. Aztreonam 2g IV Q8H.  Height: 5\' 8"  (172.7 cm) Weight: 165 lb 5.5 oz (75 kg) IBW/kg (Calculated) : 68.4  Temp (24hrs), Avg:92.6 F (33.7 C), Min:91.6 F (33.1 C), Max:95 F (35 C)  Recent Labs  Lab 04/19/19 0330 04/19/19 0505 04/19/19 0550  WBC 10.6*  --   --   CREATININE 1.20  --  1.20  LATICACIDVEN 1.7 1.3  --     Estimated Creatinine Clearance: 58.6 mL/min (by C-G formula based on SCr of 1.2 mg/dL).    No Known Allergies   Thank you for allowing pharmacy to be a part of this patient's care.  Wynona Neat, PharmD, BCPS  04/19/2019 6:58 AM

## 2019-04-19 NOTE — ED Notes (Signed)
Patient currently at CT scan with RT/RN.

## 2019-04-19 NOTE — Progress Notes (Signed)
Initial Nutrition Assessment RD working remotely.  DOCUMENTATION CODES:   Not applicable  INTERVENTION:   Begin TF via PEG:   Vital AF 1.2 at 75 ml/h (1800 ml per day)   Provides 2160 kcal, 135 gm protein, 1460 ml free water daily   Juven BID, each packet provides 80 calories, 8 grams of carbohydrate, 2.5  grams of collagen protein, 7 grams of L-arginine and 7 grams of L-glutamine; contains CaHMB, Vitamins C, E, B12 and Zinc to promote wound healing.  NUTRITION DIAGNOSIS:   Increased nutrient needs related to wound healing, acute illness as evidenced by estimated needs.  GOAL:   Patient will meet greater than or equal to 90% of their needs  MONITOR:   Labs, Skin, I & O's, Weight trends, TF tolerance  REASON FOR ASSESSMENT:   Ventilator, Consult Enteral/tube feeding initiation and management  ASSESSMENT:   68 yo male admitted with hypotension. 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  PTA, patient received Peptamen AF at 60 ml/h, Pro-stat 30 ml BID, Juven BID, and banana flakes 1 packet TID. Each packet of Banatrol banana flakes provides 120 mg potassium (360 mg total per day), which could be contributing to hyperkalemia.   Received MD Consult for TF initiation and management.  Patient is on chronic ventilator support via trach. MV: 9.3 L/min Temp (24hrs), Avg:93.6 F (34.2 C), Min:91.6 F (33.1 C), Max:96.6 F (35.9 C)   Labs reviewed. Sodium 133 (L), potassium 7.8 (H), glucose 264 (H), BUN 79 (H), creatinine 1.2 WDL, ionized calcium 1.55 (H) CBG: 73 this morning  Medications reviewed and include novolog, lokelma, levophed.   NUTRITION - FOCUSED PHYSICAL EXAM:  unable to complete  Diet Order:   Diet Order            Diet NPO  time specified  Diet effective now              EDUCATION NEEDS:   No education needs have been identified at this time  Skin:  Skin Integrity Issues:: Stage III, DTI DTI: bilateral heels Stage III: sacrum  Last BM:  no BM documented  Height:   Ht Readings from Last 1 Encounters:  04/19/19 5\' 8"  (1.727 m)    Weight:   Wt Readings from Last 1 Encounters:  04/19/19 75 kg    Ideal Body Weight:  66.5 kg  BMI:  Body mass index is 25.14 kg/m.  Estimated Nutritional Needs:   Kcal:  2000-2200  Protein:  125-145 gm  Fluid:  >/= 2 L   Molli Barrows, RD, LDN, [place] Pager [phone] After Hours Pager [phone]

## 2019-04-19 NOTE — ED Notes (Signed)
Critical Labs was given to EDP.

## 2019-04-19 NOTE — ED Notes (Signed)
Attempt blood draw with no success

## 2019-04-19 NOTE — Progress Notes (Signed)
Paged Dr. Vaughan Browner is reference to elevated Potassium level of 6.9. Orders to discontinue lactated ringers and start Normal saline at 150/hr due to LR containing potassium.

## 2019-04-19 NOTE — Progress Notes (Addendum)
Pharmacy Antibiotic Note  Alexander Scott is a 68 y.o. male admitted on 04/19/2019 with circulatory failure, sepsis, shock, and metabolic crisis.  Pharmacy consulted for antibiotic dosing.  Given the patient's AKI - will hold standing Vancomycin doses and monitor SCr trends. Most recent Pseudomonas culture showed sensitivity to Ceftazidime and Zosyn - will transition Azactam to Ceftazidime per discussion with CCM.   Plan: - Vancomycin 1500 mg LD x 1 (already given) - Hold standing doses with AKI - D/c Azactam and transition to Ceftazidime 2g IV every 12 hours - Will continue to follow renal function, culture results, LOT, and antibiotic de-escalation plans   Height: 5\' 8"  (172.7 cm) Weight: 165 lb 5.5 oz (75 kg) IBW/kg (Calculated) : 68.4  Temp (24hrs), Avg:93.6 F (34.2 C), Min:91.6 F (33.1 C), Max:96.6 F (35.9 C)  Recent Labs  Lab 04/19/19 0330 04/19/19 0505 04/19/19 0550  WBC 10.6*  --   --   CREATININE 1.20  --  1.20  LATICACIDVEN 1.7 1.3  --     Estimated Creatinine Clearance: 58.6 mL/min (by C-G formula based on SCr of 1.2 mg/dL).    No Known Allergies   Vanc 10/17 >> Azactam 10/17 x 1 Ceftazidime 10/17 >>  10/17 COVID >> neg 10/17 BCx >> 10/17 UCx >> 10/17 RCx >> 10/17 MRSA PCR >>  Thank you for allowing pharmacy to be a part of this patient's care.  Alycia Rossetti, PharmD, BCPS Clinical Pharmacist Clinical phone for 04/19/2019: [phone] 04/19/2019 9:44 AM   **Pharmacist phone directory can now be found on [email or web address] (PW [email or web address]).  Listed under [place].

## 2019-04-19 NOTE — ED Notes (Signed)
Returned from CT scan , ICU admitting MD at bedside evaluating patient .

## 2019-04-19 NOTE — ED Provider Notes (Signed)
[facility] EMERGENCY DEPARTMENT Provider Note  CSN: [id number] Arrival date & time: 04/19/19 0249  Chief Complaint(s) Respiratory Distress and Hypotension ED Triage Notes Sangalang, Modena Jansky, RN (Registered Nurse) . Marland Kitchen Emergency Medicine . Marland Kitchen 04/19/2019 2:55 AM . . Signed   Patient arrived with EMS from [facility] , staff reported increasing respiratory distress/SOB today with hypotension (50/30) , he has a tracheostomy/ventilator , received NS 700 ml IV and Epinephrine drip by EMS . Levophed drip started at arrival .       HPI Alexander Scott is a 68 y.o. male here for hypotension.  Remainder of history, ROS, and physical exam limited due to patient's condition (nonverbal). Additional information was obtained from EMS.   Level V Caveat.    HPI  Past Medical History Past Medical History:  Diagnosis Date  . Acute on chronic respiratory failure with hypoxia ([place])   . Cardiac arrest ([place])   . Cervical spinal cord injury, sequela ([place])   . Pleural effusion associated with hepatic disorder   . Polysubstance abuse ([place])   . S/P placement of cardiac pacemaker   . Subdural hematoma, post-traumatic [facility])    Patient Active Problem List   Diagnosis Date Noted  . Hypotension 04/19/2019  . Pressure ulcer of sacral region, stage 3 ([place]) 02/19/2019  . Hypercalcemia 02/19/2019  . VAP (ventilator-associated pneumonia) ([place])   . Goals of care, counseling/discussion   . Palliative care by specialist   . Urinary tract infection without hematuria   . Sepsis ([place]) 02/18/2019  . Ventilator dependence ([place])   . DNR (do not resuscitate)   . Chronic hypercapnic respiratory failure ([place])   . Paraplegia ([place])   . Cardiac arrest ([place])   . S/P placement of cardiac pacemaker   . Acute on chronic respiratory failure with hypoxia ([place])   . Cervical spinal cord injury, sequela ([place])   . Subdural hematoma, post-traumatic ([place])   . Polysubstance abuse ([place])   . Pleural  effusion associated with hepatic disorder    Home Medication(s) Prior to Admission medications   Medication Sig Start Date End Date Taking? Authorizing Provider  amantadine (SYMMETREL) 50 MG/5ML solution Place 50 mg into feeding tube 2 (two) times daily.    [provider]  Amino Acids-Protein Hydrolys (FEEDING SUPPLEMENT, PRO-STAT SUGAR FREE 64,) LIQD Place 30 mLs into feeding tube every 12 (twelve) hours.    [provider]  Banana Flakes (BANATROL PLUS) PACK Place 1 packet into feeding tube 3 (three) times daily.    [provider]  chlorhexidine (PERIDEX) 0.12 % solution 15 mLs by Mouth Rinse route 2 (two) times daily.     [provider]  clonazePAM (KLONOPIN) 0.5 MG tablet Place 0.5 mg into feeding tube every 12 (twelve) hours as needed for anxiety.    [provider]  Digestive Enzyme CAPS Place 220 mg into feeding tube daily as needed (AS DIRECTED).    [provider]  docusate sodium (COLACE) 100 MG capsule 100 mg See admin instructions. 100 mg per tube once a day    [provider]  ferrous sulfate 300 (60 Fe) MG/5ML syrup Place 325 mg into feeding tube daily.    [provider]  insulin aspart (NOVOLOG FLEXPEN) 100 UNIT/ML FlexPen Inject 0-12 Units into the skin See admin instructions. Inject 0-12 units into the skin every 6 hours, per sliding scale: "BGL <70 or >400, CALL MD; 151-200 = 2 units; 201-250 = 4 units; 251-300 = 6 units;  351-400 = 10 units; 401-500 = 12 units"    [provider]  miconazole (MICOTIN) 2 % powder Apply 1 application topically as needed for itching ((affected areas)).    [provider]  Nutritional Supplements (JUVEN NUTRIVIGOR) PACK Place 1 packet into feeding tube every 12 (twelve) hours.    [provider]  ondansetron (ZOFRAN) 4 MG/2ML SOLN injection Inject 4 mg into the vein daily as needed for nausea or vomiting.    [provider]  pantoprazole  (PROTONIX) 40 MG tablet 40 mg See admin instructions. 40 mg per tube once a day    [provider]  Peptamen AF (PEPTAMEN AF) LIQD Place 60 mLs into feeding tube continuous. 60 ml/hr    [provider]  sodium bicarbonate 650 MG tablet Place 650 mg into feeding tube daily as needed for heartburn.    [provider]  Water For Injection Sterile (STERILE WATER) injection Place 100 mLs into feeding tube every 4 (four) hours.    [provider]                                                                                                                                    Past Surgical History History reviewed. No pertinent surgical history. Family History No family history on file.  Social History Social History   Tobacco Use  . Smoking status: Not on file  Substance Use Topics  . Alcohol use: Not on file  . Drug use: Not on file   Allergies Patient has no known allergies.  Review of Systems Review of Systems  Unable to perform ROS: Patient nonverbal    Physical Exam Vital Signs  I have reviewed the triage vital signs BP (!) 63/53 (BP Location: Right Arm)   Pulse 70   Resp 14   Ht 5\' 8"  (1.727 m)   Wt 75 kg   SpO2 100%   BMI 25.14 kg/m   Physical Exam Vitals signs reviewed.  Constitutional:      General: He is in acute distress.     Appearance: He is well-developed. He is ill-appearing.  HENT:     Head: Normocephalic and atraumatic.     Nose: Nose normal.  Eyes:     General: No scleral icterus.       Right eye: No discharge.        Left eye: No discharge.     Conjunctiva/sclera: Conjunctivae normal.     Pupils: Pupils are equal, round, and reactive to light.  Neck:     Musculoskeletal: Normal range of motion and neck supple.   Cardiovascular:     Rate and Rhythm: Normal rate and regular rhythm.     Heart sounds: No murmur. No friction rub. No gallop.   Pulmonary:     Effort: Pulmonary effort is normal. Tachypnea present.      Breath sounds: Normal breath sounds. No stridor or decreased  air movement. No rales.  Abdominal:     General: There is distension.     Palpations: Abdomen is soft.     Tenderness: There is no abdominal tenderness.    Genitourinary:    Comments: Indwelling foley. Rectal tube Musculoskeletal:        General: No tenderness.  Skin:    General: Skin is warm and dry.     Findings: No erythema or rash.  Neurological:     Mental Status: He is alert.     Motor: Abnormal muscle tone present.     ED Results and Treatments Labs (all labs ordered are listed, but only abnormal results are displayed) Labs Reviewed  COMPREHENSIVE METABOLIC PANEL - Abnormal; Notable for the following components:      Result Value   Sodium 126 (*)    Potassium >7.5 (*)    CO2 19 (*)    Glucose, Bld 328 (*)    BUN 74 (*)    Total Protein 9.1 (*)    Albumin 2.7 (*)    ALT 53 (*)    Alkaline Phosphatase 133 (*)    Total Bilirubin 0.2 (*)    All other components within normal limits  CBC WITH DIFFERENTIAL/PLATELET - Abnormal; Notable for the following components:   WBC 10.6 (*)    RBC 3.26 (*)    Hemoglobin 9.3 (*)    HCT 31.2 (*)    MCHC 29.8 (*)    RDW 18.6 (*)    Neutro Abs 9.2 (*)    All other components within normal limits  URINALYSIS, ROUTINE W REFLEX MICROSCOPIC - Abnormal; Notable for the following components:   APPearance CLOUDY (*)    Specific Gravity, Urine >1.030 (*)    Hgb urine dipstick MODERATE (*)    Protein, ur 100 (*)    Leukocytes,Ua MODERATE (*)    All other components within normal limits  URINALYSIS, MICROSCOPIC (REFLEX) - Abnormal; Notable for the following components:   Bacteria, UA MANY (*)    All other components within normal limits  I-STAT CHEM 8, ED - Abnormal; Notable for the following components:   Sodium 133 (*)    Potassium 7.8 (*)    BUN 79 (*)    Glucose, Bld 264 (*)    Calcium, Ion 1.55 (*)    TCO2 20 (*)    Hemoglobin 12.9 (*)    HCT 38.0 (*)    All  other components within normal limits  POCT I-STAT EG7 - Abnormal; Notable for the following components:   pH, Ven 7.134 (*)    pO2, Ven 73.0 (*)    Bicarbonate 17.9 (*)    TCO2 19 (*)    Acid-base deficit 11.0 (*)    Sodium 130 (*)    Potassium >8.5 (*)    Calcium, Ion 1.51 (*)    HCT 32.0 (*)    Hemoglobin 10.9 (*)    All other components within normal limits  CBG MONITORING, ED - Abnormal; Notable for the following components:   Glucose-Capillary 254 (*)    All other components within normal limits  SARS CORONAVIRUS 2 BY RT PCR (HOSPITAL ORDER, [place] [facility])  CULTURE, BLOOD (ROUTINE X 2)  CULTURE, BLOOD (ROUTINE X 2)  URINE CULTURE  CULTURE, RESPIRATORY  LACTIC ACID, PLASMA  LACTIC ACID, PLASMA  PROTIME-INR  CK  MYOGLOBIN, URINE  I-STAT CHEM 8, ED  EKG  EKG Interpretation  Date/Time:  Saturday April 19 2019 07:52:34 EDT Ventricular Rate:  103 PR Interval:    QRS Duration: 172 QT Interval:  409 QTC Calculation: 538 R Axis:   0 Text Interpretation:  Sinus tachycardia Left bundle branch block Confirmed by Addison Lank [id number]) on 04/20/2019 7:49:18 AM      Radiology Dg Chest Port 1 View  Result Date: 04/19/2019 CLINICAL DATA:  Hypotension EXAM: PORTABLE CHEST 1 VIEW COMPARISON:  February 19, 2019 FINDINGS: There is mild cardiomegaly. Overlying tracheostomy tube is seen at the level of the clavicular heads. There is hazy airspace opacity seen at the right lung base. There is trace blunting of the right costophrenic angle which could be trace effusion. A left-sided pacemaker is again seen. IMPRESSION: New hazy airspace opacity at the right lung base which could be due to atelectasis and/or early infectious etiology. Trace blunting of the right costophrenic angle which could be a trace effusion. Mild cardiomegaly Electronically  Signed   By: Prudencio Pair M.D.   On: 04/19/2019 03:21    Pertinent labs & imaging results that were available during my care of the patient were reviewed by me and considered in my medical decision making (see chart for details).  Medications Ordered in ED Medications  norepinephrine (LEVOPHED) 4mg  in 278mL premix infusion (10 mcg/min Intravenous Rate/Dose Change 04/19/19 0339)  vancomycin (VANCOCIN) 1,500 mg in sodium chloride 0.9 % 500 mL IVPB (1,500 mg Intravenous New Bag/Given 04/19/19 0540)  calcium chloride 1 g in sodium chloride 0.9 % 100 mL IVPB (has no administration in time range)  heparin injection 5,000 Units (has no administration in time range)  sodium zirconium cyclosilicate (LOKELMA) packet 10 g (has no administration in time range)  insulin aspart (novoLOG) injection 0-9 Units (has no administration in time range)  LORazepam (ATIVAN) injection 2 mg (has no administration in time range)  sodium chloride 0.9 % bolus 1,000 mL (0 mLs Intravenous Stopped 04/19/19 0433)    And  sodium chloride 0.9 % bolus 1,000 mL (0 mLs Intravenous Stopped 04/19/19 0434)    And  sodium chloride 0.9 % bolus 500 mL (0 mLs Intravenous Stopped 04/19/19 0416)  aztreonam (AZACTAM) 2 g in sodium chloride 0.9 % 100 mL IVPB (0 g Intravenous Stopped 04/19/19 0612)  albuterol (PROVENTIL) (2.5 MG/3ML) 0.083% nebulizer solution 10 mg (10 mg Nebulization Given 04/19/19 0608)  insulin aspart (novoLOG) injection 10 Units (10 Units Intravenous Given 04/19/19 0535)  sodium bicarbonate injection 50 mEq (50 mEq Intravenous Given 04/19/19 0612)  iohexol (OMNIPAQUE) 350 MG/ML injection 100 mL (100 mLs Intravenous Contrast Given 04/19/19 [id number])                                                                                                                                    Procedures Ultrasound ED Peripheral IV (Provider)  Date/Time: 04/19/2019 6:46 AM Performed by: Fatima Blank, MD Authorized by:  Fatima Blank, MD   Procedure details:    Indications: hypotension and multiple failed IV attempts     Skin Prep: chlorhexidine gluconate     Location:  Right AC   Angiocath:  18 G   Bedside Ultrasound Guided: Yes     Images: archived     Patient tolerated procedure without complications: Yes     Dressing applied: Yes   .Critical Care Performed by: Fatima Blank, MD Authorized by: Fatima Blank, MD   Critical care provider statement:    Critical care time (minutes):  80   Critical care was necessary to treat or prevent imminent or life-threatening deterioration of the following conditions:  Circulatory failure, sepsis, shock and metabolic crisis   Critical care was time spent personally by me on the following activities:  Discussions with consultants, evaluation of patient's response to treatment, examination of patient, ordering and performing treatments and interventions, ordering and review of laboratory studies, ordering and review of radiographic studies, pulse oximetry, re-evaluation of patient's condition, obtaining history from patient or surrogate and review of old charts    (including critical care time)  Medical Decision Making / ED Course I have reviewed the nursing notes for this encounter and the patient's prior records (if available in EHR or on provided paperwork).   Alexander Scott was evaluated in Emergency Department on 04/19/2019 for the symptoms described in the history of present illness. He was evaluated in the context of the global COVID-19 pandemic, which necessitated consideration that the patient might be at risk for infection with the SARS-CoV-2 virus that causes COVID-19. Institutional protocols and algorithms that pertain to the evaluation of patients at risk for COVID-19 are in a state of rapid change based on information released by regulatory bodies including the CDC and federal and state organizations. These policies and  algorithms were followed during the patient's care in the ED.  Patient is hypothermic and hypotensive. Requiring ventilation.   Transition to Levophed.  Sepsis work-up initiated.  30 cc/kg of IV fluids given.  Attempt to contact son unsuccessful.  Suspect infection versus pulmonary embolism.  We will also like to evaluate with CT abdomen given firm lower abdomen.  UA suspicious for infection.  Code sepsis initiated and patient started on empiric antibiotics.  Noted to have hyperkalemia with normal renal function.  Temporizing with calcium, bicarb, insulin, and albuterol.  Admitted to ICU for further w/u and management.      Final Clinical Impression(s) / ED Diagnoses Final diagnoses:  Septic shock ([place])  Hyperkalemia      This chart was dictated using voice recognition software.  Despite best efforts to proofread,  errors can occur which can change the documentation meaning.   Fatima Blank, MD 04/20/19 [phone]

## 2019-04-19 NOTE — ED Notes (Signed)
Critical Labs was given to Nurse Mortimer Fries.

## 2019-04-19 NOTE — Progress Notes (Signed)
EEG Completed; Results Pending  

## 2019-04-19 NOTE — Progress Notes (Signed)
Patient tube feeds on hold at this time for abdominal US.

## 2019-04-19 NOTE — ED Notes (Signed)
Patient transported to CT scan . 

## 2019-04-19 NOTE — ED Notes (Signed)
Lab technician advised RN - delay in lab results due to machine breakdown .

## 2019-04-19 NOTE — Procedures (Signed)
Patient Name: Alexander Scott  MRN: [id number]  Epilepsy Attending: Lora Havens  Referring Physician/Provider: Jennelle Human, NP Date: 04/19/2019 Duration: 23.16 mins  Patient history: 68yo M with respiratory distress and abnormal movements. EEG to evaluate for seizure  Level of alertness: awake  AEDs during EEG study: clonazepam  Technical aspects: This EEG study was done with scalp electrodes positioned according to the 10-20 International system of electrode placement. Electrical activity was acquired at a sampling rate of 500Hz  and reviewed with a high frequency filter of 70Hz  and a low frequency filter of 1Hz . EEG data were recorded continuously and digitally stored.   DESCRIPTION: EEG showed continuous generalized 2-5hz  theta-delta slowing. Triphasic waves, generalized, maximal bifrontal at 1hz  were also noted.  Jaw movements were noted intermittent;y without any eeg change to suggest seizures. No clear posterior dominant rhythm was seen. Hyperventilation and photic stimulation were not performed.  ABNORMALITIES - Continuous slow, generalized - Triphasic waves, generalized, maximal bifrontal  IMPRESSION: This study is suggestive of moderate diffuse encephalopathy, likely secondary to toxic metabolic etiology. No seizures or epileptiform discharges were seen throughout the recording.  Jaw movements were noted without any EEG change to suggest seizure.

## 2019-04-19 NOTE — H&P (Signed)
NAME:  Alexander Scott, MRN:  [id number], DOB:  Jan 20, 1951, LOS: 0 ADMISSION DATE:  04/19/2019, CONSULTATION DATE:  04/19/19  CHIEF COMPLAINT:  Hypotension  Brief History   This is a 68 yo with history of trach and PEG from [facility] with hypotension and respiratory distress.  History of present illness   HPI obtained from medical chart review as patient is chronically with trach and on mechanical ventilation.   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  Recently hospitalized 8/18- 8/21 with sepsis and pseudomonas pneumonia and hypercalcemia.    In ER, patient hypothermic and hypotensive requiring levophed after 2.5L NS.  Labs noted for K >7.5, new AKI, normal lactate, Hgb 9.3, WBC 10.6, urine with mod Hgb and leukocytes with many bacteria/ WBC, CXR with right basilar opacity.  CTA abd/ pelvis and CTA pending.  Cultures sent and started on vancomycin, aztreonam, and levaquin.  Hyperkalemia treated with insulin, [id number], bicarb and calicum gluconate pending. PCCM called for admit.   Past Medical History  ant cord syndromeat c3,traumatic paraplegia s/p c3-c6 laminectomy and fusion on 5/1secondary to fall, subdural hematoma(5/1), PEA arrest(5/3)requiring intubation, failed extubation leading to tracheostomy insertion (5/18)2/2 resp muscle weakness and incr secreations, substance use disorder (cocaine, ethanol)  Significant Hospital Events   10/17 Admit  Consults:    Procedures:  PTA trach, PEG, rectal tube, foley   Significant Diagnostic Tests:  10/17 CTA chest >> 1. No evidence of pulmonary embolism. 2. Consolidation of the bilateral lower lobes with associated air  bronchograms, potentially atelectasis though worrisome for multifocal infection. 3. Ill-defined consolidative opacities within the right upper lobe worrisome for additional areas of infection and/or aspiration. 4. Approximately 1.1 cm spiculated nodular opacity within right upper lobe, potentially an additional area of infection and/or aspiration though a discrete underlying pulmonary nodule at this location is not excluded. A follow-up chest CT in 4-6 weeks after the resolution of acute symptoms is advised. 5. Presumably reactive borderline enlarged mediastinal and bilateral hilar lymph nodes - attention on follow-up is recommended. 6. Cardiomegaly. 7. Coronary calcifications. Aortic Atherosclerosis   10/17 CTA a/p >> 1. Cholelithiasis with minimal amount of pericholecystic fluid but without definitive evidence of gallbladder wall thickening. Clinical correlation for symptoms of acute cholecystitis is advised. Further evaluation could be performed with right upper quadrant abdominal ultrasound and/or nuclear medicine HIDA scan as indicated. 2. Otherwise, no acute findings within the abdomen or pelvis. 3. Note is made of a sacral decubitus ulcer with suspected osteolysis involving the right-side of the coccyx as could be seen in the setting of osteomyelitis. 4. Appropriately positioned gastrostomy and rectal tube. No evidence of enteric obstruction. 5. Nodularity of the hepatic contour as could be seen in the setting cirrhotic change. Correlation with LFTs is advised. 6. Aortic Atherosclerosis (ICD10-I70.0).   10/17 [place]ia (ordered) 10/17 EEG (ordered)  Micro Data:  10/17 SARS CoV2>> neg 10/17 BCx 2 >> 10/17 UC >> 10/17 trach asp >>   Antimicrobials:  10/17 vancomycin 10/17 azetreonam 10/17 levaquin   Interim history/subjective:  Levophed 10 mcg/min and weaning  Objective   Blood pressure (!) 171/86, pulse 92, temperature (!) 95 F (35 C), resp. rate (!) 21,  height 5\' 8"  (1.727 m), weight 75 kg, SpO2 100 %.    Vent Mode: PRVC  FiO2 (%):  [80 %-100 %] 80 % Set Rate:  [18 bmp] 18 bmp Vt Set:  [530 mL] 530 mL PEEP:  [5 cmH20] 5 cmH20 Plateau Pressure:  [24 cmH20] 24 cmH20   Intake/Output Summary (Last 24 hours) at 04/19/2019 0706 Last data filed at 04/19/2019 0434 Gross per 24 hour  Intake 2500 ml  Output -  Net 2500 ml   Filed Weights   04/19/19 0251  Weight: 75 kg    Examination: General: Patient with abnormal movement. Not able to respond to questions HENT: Trach in place. No secretions or bleeding noted around it Lungs: With coarse breath sounds bilaterally. No rhonchi or rales appreciated Cardiovascular: Regular rate and rhtym Abdomen: Firm and mild distension, No rigidit noted Extremities:Cotnrtacted Neuro: Twitching noted on exam. unsure if baelin. Does not follow. Pupils equal and reactive commands GU: Foely in place       Resolved Hospital Problem list   NA  Assessment & Plan:  This is a 68 yo with a history as noted above who presents from facility with hypothermia, hypotension and increased WOB. Recently admitted for VAP   Shock-Likely from vasoplegia/distribitive given labile temp and pressor requirment. Could be cardiac however most recent echo reassuring. Unlikely from bleed. Likely VAP vs intraabdominal given firm abdomen on exam. Vs UTI -Meropenem and vanc given history of PA with resistance to cephalosporins -Another liter of fluids in addition to 30 cc/kg -Pan culture with UCX, BXC and trach culture -TSH -FU chest CT  VAP-Patient noted to have increased WOB at OSH and CT chest with notable infiltrate -continue abx as above -FU culture   Hyperkalemia-Noted to be greater than 7.5. Shifted in ED with claclium. Given bicarb and insulin as well. Also started on continous nebs. Concern that it is likely from the [facility]. BL creatinine around 0.5 creatinine here 1.2. Also there is abnormal movement on exam. This  could be seizure like activity causing hyperkalemia. Uknown if this is baseline -Will repeat labs -Give another liter of fluids -Foley for strict I's and O's -Shift as needed -Repeat EKG now -Attain CK and continue on maintenance if positive  Abnormal movements-noted on exam. Unsure if this is baseline -Will put in for EEG -Also will attempt to attain collateral from son -Will attain CT head -If any concerns may need perform LP in future will defer until head CT returns,   Hypercalcemia-Likely from immobility and AKI -continue with fluids  -Had had Vit D and PTH on previous admission and not consistent with any concerning issue PTH low   Hyponatremia-Could be from SIADH from Pneumonia around 130-133 on admission. Does have spiculated lesion on CT chest -Continue to follow sodium daily   Sacral wound-Patent with stage 3 noted on exam today. Unlikely to be culprit of sepsis but will need monitoring -Wound consult   Best practice:  Diet: NPO for now Pain/Anxiety/Delirium protocol (if indicated): Not indicated at thist ime VAP protocol (if indicated):HOB 30 degrees DVT prophylaxis: Heparin GI prophylaxis: Protonix Glucose control: SSI Mobility: Not indicated at this time Code Status: Full as unable to attain previous DNR forms. This has been noted on previous admission. Unable to contact sone Family Communication: Attempted but unable to attain  Disposition: ICU for now  Labs   CBC: Recent Labs  Lab 04/19/19 0305 04/19/19 0330 04/19/19 0550  WBC  --  10.6*  --   NEUTROABS  --  9.2*  --   HGB 10.9* 9.3* 12.9*  HCT 32.0* 31.2* 38.0*  MCV  --  95.7  --   PLT  --  352  --     Basic Metabolic Panel: Recent Labs  Lab 04/19/19 0305 04/19/19 0330 04/19/19 0550  NA 130* 126* 133*  K >8.5* >7.5* 7.8*  CL  --  101 110  CO2  --  19*  --   GLUCOSE  --  328* 264*  BUN  --  74* 79*  CREATININE  --  1.20 1.20  CALCIUM  --  10.3  --    GFR: Estimated Creatinine  Clearance: 58.6 mL/min (by C-G formula based on SCr of 1.2 mg/dL). Recent Labs  Lab 04/19/19 0330 04/19/19 0505  WBC 10.6*  --   LATICACIDVEN 1.7 1.3    Liver Function Tests: Recent Labs  Lab 04/19/19 0330  AST 41  ALT 53*  ALKPHOS 133*  BILITOT 0.2*  PROT 9.1*  ALBUMIN 2.7*   No results for input(s): LIPASE, AMYLASE in the last 168 hours. No results for input(s): AMMONIA in the last 168 hours.  ABG    Component Value Date/Time   PHART 7.384 02/18/2019 2101   PCO2ART 41.8 02/18/2019 2101   PO2ART 90.0 02/18/2019 2101   HCO3 17.9 (L) 04/19/2019 0305   TCO2 20 (L) 04/19/2019 0550   ACIDBASEDEF 11.0 (H) 04/19/2019 0305   O2SAT 88.0 04/19/2019 0305     Coagulation Profile: Recent Labs  Lab 04/19/19 0330  INR 1.2    Cardiac Enzymes: No results for input(s): CKTOTAL, CKMB, CKMBINDEX, TROPONINI in the last 168 hours.  HbA1C: Hgb A1c MFr Bld  Date/Time Value Ref Range Status  02/18/2019 10:16 PM 5.2 4.8 - 5.6 % Final    Comment:    (NOTE) Pre diabetes:          5.7%-6.4% Diabetes:              >6.4% Glycemic control for   <7.0% adults with diabetes     CBG: Recent Labs  Lab 04/19/19 0501  GLUCAP 254*    Review of Systems:   Unable to attain as patient non verbal.  Past Medical History  He,  has a past medical history of Acute on chronic respiratory failure with hypoxia ([place]), Cardiac arrest ([place]), Cervical spinal cord injury, sequela ([place]), Pleural effusion associated with hepatic disorder, Polysubstance abuse ([place]), S/P placement of cardiac pacemaker, and Subdural hematoma, post-traumatic ([place]).   Surgical History   History reviewed. No pertinent surgical history.   Social History      Family History   His family history is not on file.   Allergies No Known Allergies   Home Medications  Prior to Admission medications   Medication Sig Start Date End Date Taking? Authorizing Provider  amantadine (SYMMETREL) 50 MG/5ML solution Place 50 mg  into feeding tube 2 (two) times daily.    [provider]  Amino Acids-Protein Hydrolys (FEEDING SUPPLEMENT, PRO-STAT SUGAR FREE 64,) LIQD Place 30 mLs into feeding tube every 12 (twelve) hours.    [provider]  Banana Flakes (BANATROL PLUS) PACK Place 1 packet into feeding tube 3 (three) times daily.    [provider]  chlorhexidine (PERIDEX) 0.12 % solution 15 mLs by Mouth Rinse route 2 (two) times daily.     [provider]  clonazePAM (KLONOPIN) 0.5 MG tablet Place 0.5 mg into feeding tube every 12 (twelve) hours as needed for anxiety.    [provider]  Digestive Enzyme CAPS Place 220 mg into feeding tube daily as needed (  AS DIRECTED).    [provider]  docusate sodium (COLACE) 100 MG capsule 100 mg See admin instructions. 100 mg per tube once a day    [provider]  ferrous sulfate 300 (60 Fe) MG/5ML syrup Place 325 mg into feeding tube daily.    [provider]  insulin aspart (NOVOLOG FLEXPEN) 100 UNIT/ML FlexPen Inject 0-12 Units into the skin See admin instructions. Inject 0-12 units into the skin every 6 hours, per sliding scale: "BGL <70 or >400, CALL MD; 151-200 = 2 units; 201-250 = 4 units; 251-300 = 6 units; 351-400 = 10 units; 401-500 = 12 units"    [provider]  miconazole (MICOTIN) 2 % powder Apply 1 application topically as needed for itching ((affected areas)).    [provider]  Nutritional Supplements (JUVEN NUTRIVIGOR) PACK Place 1 packet into feeding tube every 12 (twelve) hours.    [provider]  ondansetron (ZOFRAN) 4 MG/2ML SOLN injection Inject 4 mg into the vein daily as needed for nausea or vomiting.    [provider]  pantoprazole (PROTONIX) 40 MG tablet 40 mg See admin instructions. 40 mg per tube once a day    [provider]  Peptamen AF (PEPTAMEN AF) LIQD Place 60 mLs into feeding tube continuous. 60 ml/hr    [provider]   sodium bicarbonate 650 MG tablet Place 650 mg into feeding tube daily as needed for heartburn.    [provider]  Water For Injection Sterile (STERILE WATER) injection Place 100 mLs into feeding tube every 4 (four) hours.    [provider]     Critical care time: 48

## 2019-04-20 ENCOUNTER — Inpatient Hospital Stay (HOSPITAL_COMMUNITY): Payer: Medicare Other

## 2019-04-20 DIAGNOSIS — R6521 Severe sepsis with septic shock: Secondary | ICD-10-CM

## 2019-04-20 DIAGNOSIS — J9600 Acute respiratory failure, unspecified whether with hypoxia or hypercapnia: Secondary | ICD-10-CM

## 2019-04-20 LAB — COMPREHENSIVE METABOLIC PANEL
ALT: 53 U/L — ABNORMAL HIGH (ref 0–44)
AST: 46 U/L — ABNORMAL HIGH (ref 15–41)
Albumin: 2.1 g/dL — ABNORMAL LOW (ref 3.5–5.0)
Alkaline Phosphatase: 99 U/L (ref 38–126)
Anion gap: 8 (ref 5–15)
BUN: 77 mg/dL — ABNORMAL HIGH (ref 8–23)
CO2: 22 mmol/L (ref 22–32)
Calcium: 10.4 mg/dL — ABNORMAL HIGH (ref 8.9–10.3)
Chloride: 108 mmol/L (ref 98–111)
Creatinine, Ser: 1.17 mg/dL (ref 0.61–1.24)
GFR calc Af Amer: >60 mL/min (ref >60)
GFR calc non Af Amer: >60 mL/min (ref >60)
Glucose, Bld: 110 mg/dL — ABNORMAL HIGH (ref 70–99)
Potassium: 5.4 mmol/L — ABNORMAL HIGH (ref 3.5–5.1)
Sodium: 138 mmol/L (ref 135–145)
Total Bilirubin: 1 mg/dL (ref 0.3–1.2)
Total Protein: 7.5 g/dL (ref 6.5–8.1)

## 2019-04-20 LAB — GLUCOSE, CAPILLARY
Glucose-Capillary: 104 mg/dL — ABNORMAL HIGH (ref 70–99)
Glucose-Capillary: 121 mg/dL — ABNORMAL HIGH (ref 70–99)
Glucose-Capillary: 121 mg/dL — ABNORMAL HIGH (ref 70–99)
Glucose-Capillary: 130 mg/dL — ABNORMAL HIGH (ref 70–99)
Glucose-Capillary: 86 mg/dL (ref 70–99)
Glucose-Capillary: 89 mg/dL (ref 70–99)
Glucose-Capillary: 90 mg/dL (ref 70–99)

## 2019-04-20 LAB — RENAL FUNCTION PANEL
Albumin: 2 g/dL — ABNORMAL LOW (ref 3.5–5.0)
Anion gap: 10 (ref 5–15)
BUN: 77 mg/dL — ABNORMAL HIGH (ref 8–23)
CO2: 21 mmol/L — ABNORMAL LOW (ref 22–32)
Calcium: 10.3 mg/dL (ref 8.9–10.3)
Chloride: 108 mmol/L (ref 98–111)
Creatinine, Ser: 0.98 mg/dL (ref 0.61–1.24)
GFR calc Af Amer: >60 mL/min (ref >60)
GFR calc non Af Amer: >60 mL/min (ref >60)
Glucose, Bld: 118 mg/dL — ABNORMAL HIGH (ref 70–99)
Phosphorus: 3 mg/dL (ref 2.5–4.6)
Potassium: 5.4 mmol/L — ABNORMAL HIGH (ref 3.5–5.1)
Sodium: 139 mmol/L (ref 135–145)

## 2019-04-20 LAB — CBC
HCT: 23 % — ABNORMAL LOW (ref 39.0–52.0)
Hemoglobin: 7.3 g/dL — ABNORMAL LOW (ref 13.0–17.0)
MCH: 28.2 pg (ref 26.0–34.0)
MCHC: 31.7 g/dL (ref 30.0–36.0)
MCV: 88.8 fL (ref 80.0–100.0)
Platelets: 262 10*3/uL (ref 150–400)
RBC: 2.59 MIL/uL — ABNORMAL LOW (ref 4.22–5.81)
RDW: 19.2 % — ABNORMAL HIGH (ref 11.5–15.5)
WBC: 11.1 10*3/uL — ABNORMAL HIGH (ref 4.0–10.5)
nRBC: 0.2 % (ref 0.0–0.2)

## 2019-04-20 LAB — MAGNESIUM
Magnesium: 1.6 mg/dL — ABNORMAL LOW (ref 1.7–2.4)
Magnesium: 2.1 mg/dL (ref 1.7–2.4)

## 2019-04-20 LAB — PHOSPHORUS
Phosphorus: 3 mg/dL (ref 2.5–4.6)
Phosphorus: 3.1 mg/dL (ref 2.5–4.6)

## 2019-04-20 LAB — VANCOMYCIN, RANDOM: Vancomycin Rm: 15

## 2019-04-20 MED ORDER — VANCOMYCIN HCL IN DEXTROSE 1-5 GM/200ML-% IV SOLN
1000.0000 mg | INTRAVENOUS | Status: DC
  Filled 2019-04-20: qty 200

## 2019-04-20 MED ORDER — SODIUM CHLORIDE 0.9 % IV SOLN
2.0000 g | Freq: Three times a day (TID) | INTRAVENOUS | Status: DC
  Administered 2019-04-20 – 2019-04-22 (×7): 2 g via INTRAVENOUS
  Filled 2019-04-20 (×10): qty 2

## 2019-04-20 MED ORDER — MAGNESIUM SULFATE 2 GM/50ML IV SOLN
2.0000 g | Freq: Once | INTRAVENOUS | Status: AC
  Administered 2019-04-20: 2 g via INTRAVENOUS
  Filled 2019-04-20: qty 50

## 2019-04-20 MED ORDER — METOPROLOL TARTRATE 5 MG/5ML IV SOLN
5.0000 mg | Freq: Once | INTRAVENOUS | Status: AC
  Administered 2019-04-20: 5 mg via INTRAVENOUS
  Filled 2019-04-20: qty 5

## 2019-04-20 NOTE — Progress Notes (Signed)
Pharmacy Antibiotic Note  Alexander Scott is a 68 y.o. male admitted on 04/19/2019 with circulatory failure, sepsis, shock, and metabolic crisis.  Pharmacy consulted for antibiotic dosing.  The patient was noted to be in AKI on admission - now resolving with SCr down to 0.98. Vancomycin will be discontinued today per discussion with CCM. Will adjust Ceftazidime doses.  Plan: - D/c Vancomycin - Adjust Ceftazidime to 2g IV every 8 hours - Will continue to follow renal function, culture results, LOT, and antibiotic de-escalation plans   Height: 5\' 8"  (172.7 cm) Weight: 154 lb 8.7 oz (70.1 kg) IBW/kg (Calculated) : 68.4  Temp (24hrs), Avg:97.8 F (36.6 C), Min:96.3 F (35.7 C), Max:99.5 F (37.5 C)  Recent Labs  Lab 04/19/19 0330 04/19/19 0505 04/19/19 0550 04/19/19 1653 04/19/19 2242 04/20/19 0500 04/20/19 0735  WBC 10.6*  --   --   --   --   --  11.1*  CREATININE 1.20  --  1.20 1.19 1.21 1.17 0.98  LATICACIDVEN 1.7 1.3  --   --   --   --   --   VANCORANDOM  --   --   --   --   --   --  15    Estimated Creatinine Clearance: 71.7 mL/min (by C-G formula based on SCr of 0.98 mg/dL).    No Known Allergies   Vanc 10/17 >> 10/18 10/18 VR: 15, est ke 0.029 Azactam 10/17 x 1 Ceftazidime 10/17 >>  10/17 COVID >> neg 10/17 BCx >> ngtd 10/17 UCx >> 100k GNR 10/17 RCx >> 10/17 MRSA PCR >> neg  Thank you for allowing pharmacy to be a part of this patient's care.  Alycia Rossetti, PharmD, BCPS Clinical Pharmacist Clinical phone for 04/20/2019: [phone] 04/20/2019 10:05 AM   **Pharmacist phone directory can now be found on [email or web address] ([email or web address]).  Listed under [place].

## 2019-04-20 NOTE — Progress Notes (Signed)
[facility] Progress Note Patient Name: Alexander Scott DOB: [birth year] MRN: [id number]   Date of Service  04/20/2019  HPI/Events of Note  Sinus Tachycardia - HR = 137. BP = 145/90 with MAP = 106.   eICU Interventions  Will order: 1. Metoprolol 5 mg IV X 1 now.      Intervention Category Major Interventions: Arrhythmia - evaluation and management  [name] 04/20/2019, 4:27 AM

## 2019-04-20 NOTE — Progress Notes (Signed)
Pt currently weaning on 18 psv, rr 28-33 bpm.  Pt did not tolerate lower psv settings d/t high rr >35 bpm.  RN in room and aware.

## 2019-04-20 NOTE — Progress Notes (Addendum)
NAME:  Alexander Scott, MRN:  [id number], DOB:  Nov 10, 1950, LOS: 1 ADMISSION DATE:  04/19/2019, CONSULTATION DATE:  04/19/19  CHIEF COMPLAINT:  Hypotension  Brief History   67 yo with history of trach and PEG from [facility] with hypotension and respiratory distress. Admitted for new AKI, hyperkalemia, hypothermia need for for pressors.   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    Significant Hospital Events   10/17 Admit  Consults:    Procedures:  PTA trach, PEG, rectal tube, foley   Significant Diagnostic Tests:  CT head 04/19/2019- 3 mm SDH.   CT chest, abd, pelvis 04/19/2019-shows multifocal PNA, RUL nodular density, cholelithiasis with mimimal pericholecystic fluod.   Right upper quadrant ultrasound 04/19/19- Adenomyotosis of gallbladder.  No evidence of acute cholecystitis  EEG 04/19/19-moderate diffuse encephalopathy.  No seizures.  Micro Data:  10/17 SARS CoV2>> neg 10/17 BCx 2 >> 10/17 UC >> gram-negative rods 10/17 trach asp >>   Antimicrobials:  10/17 azetreonam 10/17 levaquin  10/17 vancomycin >> 10/17 ceftaz >>  Interim history/subjective:  Off Levophed.  K is improving.  Making some urine.  Objective   Blood pressure 127/76, pulse (!) 103, temperature (!) 97.2 F (36.2 C), temperature source Bladder, resp. rate 18, height 5\' 8"  (1.727 m), weight 70.1 kg, SpO2 100 %.    Vent Mode: PRVC FiO2 (%):  [40 %] 40 % Set Rate:  [18 bmp] 18 bmp Vt Set:  [540 mL] 540 mL PEEP:  [5 cmH20] 5 cmH20 Plateau Pressure:  [22 cmH20-26 cmH20] 26 cmH20   Intake/Output Summary  (Last 24 hours) at 04/20/2019 0841 Last data filed at 04/20/2019 0800 Gross per 24 hour  Intake 5609.2 ml  Output 2000 ml  Net 3609.2 ml   Filed Weights   04/19/19 0251 04/20/19 0500  Weight: 75 kg 70.1 kg    Examination: Gen:      No acute distress HEENT:  EOMI, sclera anicteric Neck:     No masses; no thyromegaly, trach Lungs:    Clear to auscultation bilaterally; normal respiratory effort CV:         Regular rate and rhythm; no murmurs Abd:      + bowel sounds; soft, non-tender; no palpable masses, no distension Ext:    No edema; adequate peripheral perfusion Skin:      Warm and dry; no rash Neuro: Sedated, unresponsive  Resolved Hospital Problem list   NA  Assessment & Plan:  Septic shock, Likely source is urine with gram-negative rods Off pressors now Continue Vanco, ceftaz.  Follow final cultures DC IV fluids  Chronic respiratory failure, trach Will attempt pressure support weans.  Follow chest x-ray  VAP.  CT noted with multifocal infiltrates. Continue abx as above Will need follow up CT to ensure nodular density is improving.   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 Does have spiculated lesion on CT chest Monitor metabolic panel  Sacral wound-Patent with stage 3 Wound consult  Small subdural on CT scan Reviewed with neurosurgery.  No interventions necessary  Best practice:  Diet: Tube feeds Pain/Anxiety/Delirium protocol (if indicated): Fentanyl PRN VAP protocol (if indicated):HOB 30 degrees DVT prophylaxis: Heparin GI prophylaxis: Protonix  Glucose control: SSI Mobility: Not indicated at this time Code Status: Full as unable to attain previous DNR forms. This has been noted on previous admission. Unable to contact son Family Communication: Multiple attempts to call son were unsucessful. Left voice message 10/18 Disposition: ICU  Labs   CBC:  Recent Labs  Lab 04/19/19 0305 04/19/19 0330 04/19/19 0550  WBC  --  10.6*  --   NEUTROABS  --  9.2*  --   HGB 10.9* 9.3* 12.9*  HCT 32.0* 31.2* 38.0*  MCV  --  95.7  --   PLT  --  352  --     Basic Metabolic Panel: Recent Labs  Lab 04/19/19 0330 04/19/19 0550 04/19/19 0931 04/19/19 1653 04/19/19 2242 04/20/19 0500  NA 126* 133*  --  132* 135 138  K >7.5* 7.8*  --  6.9* 6.1* 5.4*  CL 101 110  --  108 111 108  CO2 19*  --   --  17* 19* 22  GLUCOSE 328* 264*  --  82 84 110*  BUN 74* 79*  --  77* 75* 77*  CREATININE 1.20 1.20  --  1.19 1.21 1.17  CALCIUM 10.3  --   --  10.4* 10.2 10.4*  MG  --   --  1.5* 1.5*  --  1.6*  PHOS  --   --  2.6 2.3*  --  3.0   GFR: Estimated Creatinine Clearance: 60.1 mL/min (by C-G formula based on SCr of 1.17 mg/dL). Recent Labs  Lab 04/19/19 0330 04/19/19 0505  WBC 10.6*  --   LATICACIDVEN 1.7 1.3    Liver Function Tests: Recent Labs  Lab 04/19/19 0330 04/20/19 0500  AST 41 46*  ALT 53* 53*  ALKPHOS 133* 99  BILITOT 0.2* 1.0  PROT 9.1* 7.5  ALBUMIN 2.7* 2.1*   No results for input(s): LIPASE, AMYLASE in the last 168 hours. No results for input(s): AMMONIA in the last 168 hours.  ABG    Component Value Date/Time   PHART 7.384 02/18/2019 2101   PCO2ART 41.8 02/18/2019 2101   PO2ART 90.0 02/18/2019 2101   HCO3 17.9 (L) 04/19/2019 0305   TCO2 20 (L) 04/19/2019 0550   ACIDBASEDEF 11.0 (H) 04/19/2019 0305   O2SAT 88.0 04/19/2019 0305     Coagulation Profile: Recent Labs  Lab 04/19/19 0330  INR 1.2    Cardiac Enzymes: Recent Labs  Lab 04/19/19 0642  CKTOTAL 64    HbA1C: Hgb A1c MFr Bld  Date/Time Value Ref Range Status  02/18/2019 10:16 PM 5.2 4.8 - 5.6 % Final    Comment:    (NOTE) Pre diabetes:          5.7%-6.4% Diabetes:              >6.4% Glycemic control for   <7.0% adults with diabetes     CBG: Recent Labs  Lab 04/19/19 1928 04/19/19 2024 04/20/19 0005 04/20/19 0416 04/20/19 0745   GLUCAP 62* 81 89 104* 121*   The patient is critically ill with multiple organ system failure and requires high complexity decision making for assessment and support, frequent evaluation and titration of therapies, advanced monitoring, review of radiographic studies and interpretation of complex data.   Critical Care Time devoted to patient care services, exclusive of separately billable procedures, described in this note is 35 minutes.   Marshell Garfinkel MD [facility] Pager [phone] If no answer call [phone] 04/20/2019, 8:41 AM

## 2019-04-21 ENCOUNTER — Inpatient Hospital Stay (HOSPITAL_COMMUNITY): Payer: Medicare Other

## 2019-04-21 LAB — CBC
HCT: 23.5 % — ABNORMAL LOW (ref 39.0–52.0)
Hemoglobin: 7.7 g/dL — ABNORMAL LOW (ref 13.0–17.0)
MCH: 28.8 pg (ref 26.0–34.0)
MCHC: 32.8 g/dL (ref 30.0–36.0)
MCV: 88 fL (ref 80.0–100.0)
Platelets: 275 10*3/uL (ref 150–400)
RBC: 2.67 MIL/uL — ABNORMAL LOW (ref 4.22–5.81)
RDW: 19.5 % — ABNORMAL HIGH (ref 11.5–15.5)
WBC: 12.2 10*3/uL — ABNORMAL HIGH (ref 4.0–10.5)
nRBC: 0.2 % (ref 0.0–0.2)

## 2019-04-21 LAB — BASIC METABOLIC PANEL
Anion gap: 7 (ref 5–15)
BUN: 68 mg/dL — ABNORMAL HIGH (ref 8–23)
CO2: 24 mmol/L (ref 22–32)
Calcium: 10.8 mg/dL — ABNORMAL HIGH (ref 8.9–10.3)
Chloride: 111 mmol/L (ref 98–111)
Creatinine, Ser: 0.9 mg/dL (ref 0.61–1.24)
GFR calc Af Amer: >60 mL/min (ref >60)
GFR calc non Af Amer: >60 mL/min (ref >60)
Glucose, Bld: 118 mg/dL — ABNORMAL HIGH (ref 70–99)
Potassium: 5.3 mmol/L — ABNORMAL HIGH (ref 3.5–5.1)
Sodium: 142 mmol/L (ref 135–145)

## 2019-04-21 LAB — GLUCOSE, CAPILLARY
Glucose-Capillary: 108 mg/dL — ABNORMAL HIGH (ref 70–99)
Glucose-Capillary: 112 mg/dL — ABNORMAL HIGH (ref 70–99)
Glucose-Capillary: 116 mg/dL — ABNORMAL HIGH (ref 70–99)
Glucose-Capillary: 117 mg/dL — ABNORMAL HIGH (ref 70–99)
Glucose-Capillary: 122 mg/dL — ABNORMAL HIGH (ref 70–99)
Glucose-Capillary: 123 mg/dL — ABNORMAL HIGH (ref 70–99)
Glucose-Capillary: 124 mg/dL — ABNORMAL HIGH (ref 70–99)

## 2019-04-21 LAB — PHOSPHORUS: Phosphorus: 3.7 mg/dL (ref 2.5–4.6)

## 2019-04-21 LAB — MAGNESIUM: Magnesium: 1.9 mg/dL (ref 1.7–2.4)

## 2019-04-21 MED ORDER — FUROSEMIDE 10 MG/ML IJ SOLN
40.0000 mg | Freq: Once | INTRAMUSCULAR | Status: AC
  Administered 2019-04-21: 40 mg via INTRAVENOUS
  Filled 2019-04-21: qty 4

## 2019-04-21 MED ORDER — SODIUM CHLORIDE 0.9 % IV SOLN: INTRAVENOUS | Status: DC | PRN

## 2019-04-21 MED ORDER — AMANTADINE HCL 50 MG/5ML PO SYRP
50.0000 mg | ORAL_SOLUTION | Freq: Two times a day (BID) | ORAL | Status: DC
  Administered 2019-04-21 – 2019-04-22 (×3): 50 mg
  Filled 2019-04-21 (×5): qty 5

## 2019-04-21 MED ORDER — FERROUS SULFATE 300 (60 FE) MG/5ML PO SYRP
300.0000 mg | ORAL_SOLUTION | Freq: Two times a day (BID) | ORAL | Status: DC
  Administered 2019-04-21 – 2019-04-22 (×3): 300 mg
  Filled 2019-04-21 (×4): qty 5

## 2019-04-21 MED ORDER — COLLAGENASE 250 UNIT/GM EX OINT
TOPICAL_OINTMENT | Freq: Every day | CUTANEOUS | Status: DC
  Administered 2019-04-21 – 2019-04-22 (×2): via TOPICAL
  Filled 2019-04-21: qty 30

## 2019-04-21 NOTE — Progress Notes (Signed)
NAME:  Alexander Scott, MRN:  [id number], DOB:  1951/05/30, LOS: 2 ADMISSION DATE:  04/19/2019, CONSULTATION DATE:  04/19/19  CHIEF COMPLAINT:  Hypotension  Brief History   68 yo with history of trach and PEG from [facility] with hypotension and respiratory distress. Admitted for new AKI, hyperkalemia, hypothermia need for for pressors.   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    Significant Hospital Events   10/17 Admit 10/18 Vanc stopped 10/19 Off pressors changing foley cath   Consults:    Procedures:  PTA trach, PEG, rectal tube, foley   Significant Diagnostic Tests:  CT head 04/19/2019- 3 mm SDH.  CT chest, abd, pelvis 04/19/2019-shows multifocal PNA, RUL nodular density, cholelithiasis with mimimal pericholecystic fluod.  Right upper quadrant ultrasound 04/19/19- Adenomyotosis of gallbladder.  No evidence of acute cholecystitis EEG 04/19/19-moderate diffuse encephalopathy.  No seizures.  Micro Data:  10/17 SARS CoV2>> neg 10/17 BCx 2 >> 10/17 UC: klebsiella PNA>>> 10/19 respiratory culture>>>   Antimicrobials:  10/17 azetreonam 10/17 levaquin  10/17 vancomycin >>10/18 10/17 ceftaz >>  Interim history/subjective:  Off pressors. Seems comfortable. Still having copious creamy secretions   Objective   Blood pressure 114/75, pulse (Abnormal) 107, temperature 98.6 F (37 C), resp. rate 19, height 5\' 8"  (1.727 m), weight 71.1 kg, SpO2 98 %.    Vent Mode: PRVC FiO2 (%):  [40 %] 40 % Set Rate:  [18 bmp] 18 bmp Vt Set:  [540 mL] 540 mL PEEP:  [5 cmH20] 5 cmH20 Pressure Support:   [15 cmH20-18 cmH20] 15 cmH20 Plateau Pressure:  [20 cmH20-37 cmH20] 37 cmH20   Intake/Output Summary (Last 24 hours) at 04/21/2019 0829 Last data filed at 04/21/2019 0800 Gross per 24 hour  Intake 2517.55 ml  Output 3700 ml  Net -1182.45 ml   Filed Weights   04/19/19 0251 04/20/19 0500 04/21/19 0140  Weight: 75 kg 70.1 kg 71.1 kg    Examination:  General- this is a chronically ill appearing black male who is ventilator dependent  HENT -temporal wasting, poor dentition trach site unremarkable  Pulm coarse diffuse bilateral rhonchi w/ tactile frem.  Card RRR distant w/ paced rhythm on tele abd PEG unremarkable + bowel sounds liquid stools GU chronic foley cath w/ scrotal and penile edema. Urine is cloudy  Neuro gross upper movement. LUE is contracted. Not interactive appears to have intermittent pain  Ext diffuse anasarca. Skin is dry    Resolved Hospital Problem list   Septic shock (resolved 10/18) AKI Hyponatremia   Assessment & Plan:   Severe sepsis 2/2 UT source. + probable VAP (NOS to date) -now off pressors Growing K PNA in urine.  Plan Cont ceftaz day 3; await final sensitivities  Change out [facility] Get sputum culture Will need f/u CT chest at some point to ensure nodularity improving although NOT sure that he would be a candidate for bx even if it was worsening  Chronic respiratory failure, trach pcxr personally reviewed: RLL infiltrate a little worse. Otherwise no sig change. Support tubing in satisfactory position  Plan Cont vent support PSV as tolerated; but would not do ATC  VAP bundle   Fluid and electrolyte imbalance: hyperkalemia, hypercalcemia -not clear what is driving the hypercalcemia.  Plan Lasix x 1 Repeat chem this afternoon; Consider lokalma  Quadriplegia w/ gross UE movement.  Still has episodes of tachycardia and fever swings Plan Add back symmetrel and cont klonopin  Sacral wound-Patent with stage 3 Plan WOC consult placed   Small  subdural on CT scan Reviewed with neurosurgery.   Plan No intervention warranted   Anemia of chronic disease -do not see evidence of bleeding Plan Trend cbc Transfuse for hgb < 7   Best practice:  Diet: Tube feeds Pain/Anxiety/Delirium protocol (if indicated): Fentanyl PRN VAP protocol (if indicated):HOB 30 degrees DVT prophylaxis: Heparin GI prophylaxis: Protonix Glucose control: SSI Mobility: Not indicated at this time Code Status: Full as unable to attain previous DNR forms. This has been noted on previous admission. Unable to contact son Family Communication: Multiple attempts to call son were unsucessful. Left voice message 10/18 Disposition: shock state resolved. Awaiting final sensitivities from urine. Will change out foley cath. Will ask CM to eval. Can probably go back to [facility] as soon as tomorrow.   My critical care time 32 minutes   Erick Colace ACNP-BC [place] Pager # [phone] OR # [phone] if no answer    04/21/2019, 8:29 AM

## 2019-04-21 NOTE — Consult Note (Signed)
[facility] Nurse wound consult note Reason for Consult: Consult requested for sacrum wound.  Performed remotely after review of progress notes and photo in the EMR. Wound type: Sacrum with chronic unstageable pressure injury; large amt yellow slough and pink dry scar tissue surrounding Pt also has bilateral deep tissue pressure injuries (DTPI) to heels according to the nursing wound flowsheet Pressure Injury POA: Yes Dressing procedure/placement/frequency: Pt is critically ill with multiple systemic factors which can impair healing. They are on a low airloss mattress to reduce pressure.  Prevalon heel lift boots ordered to reduce pressure to DTPI. Santyl to provide enzymatic debridement of nonviable tissue to sacrum. Please re-consult if further assistance is needed.  Thank-you,  Julien Girt MSN, [place], [place], [place], [place]

## 2019-04-22 LAB — URINE CULTURE: Culture: >=100000 — AB

## 2019-04-22 LAB — GLUCOSE, CAPILLARY
Glucose-Capillary: 103 mg/dL — ABNORMAL HIGH (ref 70–99)
Glucose-Capillary: 95 mg/dL (ref 70–99)
Glucose-Capillary: 117 mg/dL — ABNORMAL HIGH (ref 70–99)

## 2019-04-22 MED ORDER — SODIUM CHLORIDE 0.9 % IV SOLN: 2.0000 g | Freq: Three times a day (TID) | INTRAVENOUS | Status: AC

## 2019-04-22 MED ORDER — STERILE WATER FOR INJECTION IV SOLN: 100.0000 mL | Freq: Four times a day (QID) | INTRAVENOUS | Status: AC

## 2019-04-22 MED ORDER — COLLAGENASE 250 UNIT/GM EX OINT: TOPICAL_OINTMENT | Freq: Every day | CUTANEOUS | 0 refills | Status: AC

## 2019-04-22 NOTE — Plan of Care (Signed)
  Problem: Elimination: Goal: Will not experience complications related to bowel motility Outcome: Progressing Goal: Will not experience complications related to urinary retention Outcome: Progressing   Problem: Pain Managment: Goal: General experience of comfort will improve Outcome: Progressing   Problem: Activity: Goal: Risk for activity intolerance will decrease Outcome: Not Progressing Note: Pt paraplegic. Chronically bedbound

## 2019-04-22 NOTE — Care Management (Signed)
Pt is from [facility], pt reviewed and deemed inappropriate for [facility] discharge.  CSW will l facility pt returning to [facility]

## 2019-04-22 NOTE — Discharge Summary (Signed)
Physician Discharge Summary         Patient ID: KNOXX BAETZ MRN: [id number] DOB/AGE: 1951-06-08 68 y.o.  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  He was intubated, required prolonged ventilation, failed extubation and eventually required tracheostomy placement and was sent to [facility] home.  More recently he had been treated in August for Pseudomonas pneumonia and hypercalcemia.  Presents on 10/17 from [place] home with new hypotension, increased work of breathing, hypothermia and acute kidney injury He was admitted with a working diagnosis of septic shock presumptively from chronic urinary catheter and pneumonia.  Pan cultures were sent, empiric antibiotics were initiated.  He was supported on norepinephrine and provided with aggressive volume resuscitation.  We were able to wean his vasoactive drips off in the first 48 hours, renal function improved with IV hydration and endorgan support.  Cultures came back with Klebsiella pneumonia which was pansensitive in the urinary culture, we further cultured his sputum which at time of dictation was growing gram-negative rods.  At this point he remains on ceftazidime, vancomycin was discontinued on 10/18.  At time of dictation he remains ventilator dependent,  this is baseline.  He is hemodynamically stable.  His renal function has improved.  And he appears back to baseline and is ready to be transferred back to [place] home.  Discharge Plan by Active Problems    Severe sepsis 2/2 UT source. + probable VAP (NOS to date) -now off pressors Growing K PNA in urine.  Plan Day 4 ceftaz (awaiting final sensitivities but prelim not a esbl producer so no need to widen), would treat for 4 more days Consider f/u CT chest at some point to f/u pulm nodules although even if still present he would not be an candidate for cancer treatment   Chronic respiratory failure, trach pcxr personally reviewed: RLL infiltrate a little worse. Otherwise no sig change. Support tubing in satisfactory position  Plan Cont full vent support; PSV as tolerated VAP bundle   Fluid and electrolyte imbalance: hyperkalemia, hypercalcemia -not clear what is driving the hypercalcemia.   Plan Repeat chem this am (got lasix 10/19)  Quadriplegia w/ gross UE movement.  Still has episodes of tachycardia and fever swings Plan Cont symmetrel and clonazepam   Sacral wound-Patent with stage 3 Plan WOC placed.  prevalon boots to LEs Santyl to sacral ulcer  Small subdural on CT scan Reviewed with neurosurgery.   Plan No intervention warranted  Anemia of chronic disease -do not see evidence of bleeding Plan Repeat CBC in am  Transfuse for hgb < 7   Significant Hospital tests/ studies  CT head 04/19/2019- 3 mm SDH. CT chest, abd, pelvis 04/19/2019-shows multifocal PNA, RUL nodular density, cholelithiasis with mimimal pericholecystic fluod.  Right upper quadrant ultrasound 04/19/19- Adenomyotosis of gallbladder.  No evidence of acute cholecystitis EEG 04/19/19-moderate diffuse encephalopathy.  No seizures. Procedures    Culture data/antimicrobials   10/17 SARS CoV2>> neg 10/17 BCx 2 >> 10/17 UC:  klebsiella PNA 10/19 respiratory culture: GNR>>>   10/17  azetreonam 10/17 levaquin  10/17 vancomycin >>10/18 10/17 ceftaz >> Consults   Wound ostomy nurse  Discharge Exam: Blood Pressure 110/77 (BP Location: Right Arm)   Pulse (Abnormal) 111   Temperature 97.7 F (36.5 C) (Oral)   Respiration 19   Height 5\' 8"  (1.727 m)   Weight 69.4 kg   Oxygen Saturation 98%   Body Mass Index 23.26 kg/m   General: This is a chronically ill-appearing 68 year old black male he remains ventilator dependent and tracheostomy dependent he is in no acute distress this morning HEENT poor dentition tracheal sites unremarkable mucous membranes moist no scleral edema  Pulmonary: Coarse scattered rhonchi Cardiac mild tachycardia Abdomen soft nontender PEG unremarkable GU clear yellow Neuro gross upper movement of extremities, will not follow commands.  Grimaces but does not follow commands or attempt to communicate Ext General anasarca pulses palpable Prevalon boots to bilateral lower extremities. Labs at discharge   Lab Results  Component Value Date   CREATININE 0.90 04/21/2019   BUN 68 (H) 04/21/2019   NA 142 04/21/2019   K 5.3 (H) 04/21/2019   CL 111 04/21/2019   CO2 24 04/21/2019   Lab Results  Component Value Date   WBC 12.2 (H) 04/21/2019   HGB 7.7 (L) 04/21/2019   HCT 23.5 (L) 04/21/2019   MCV 88.0 04/21/2019   PLT 275 04/21/2019   Lab Results  Component Value Date   ALT 53 (H) 04/20/2019   AST 46 (H) 04/20/2019   ALKPHOS 99 04/20/2019   BILITOT 1.0 04/20/2019   Lab Results  Component Value Date   INR 1.2 04/19/2019    Current radiological studies    Dg Chest Port 1 View  Result Date: 04/21/2019 CLINICAL DATA:  Acute respiratory failure EXAM: PORTABLE CHEST 1 VIEW COMPARISON:  There yesterday FINDINGS: Lung opacity at both bases with lower lobe collapse and right base airspace disease on recent chest CT. Dual-chamber pacer leads from the left are in stable position. Interface over the peripheral right chest is more consistent  with skin fold than pneumothorax. Tracheostomy tube in place. IMPRESSION: Lower lobe collapse and consolidation that is similar to yesterday. Electronically Signed   By: Monte Fantasia M.D.   On: 04/21/2019 05:40    Disposition:    Discharge disposition: [place] Not Defined         Allergies as of 04/22/2019   No Known Allergies     Medication List    Take these medications   acetaminophen 325 MG tablet Commonly known as: TYLENOL Place 650 mg into feeding tube every 6 (six) hours as needed (fever greater than 100.6 F).   amantadine 50 MG/5ML solution Commonly known as: SYMMETREL Place 50 mg into feeding tube every 12 (twelve) hours.   cefTAZidime 2 g in sodium chloride 0.9 % 100 mL Inject 2 g into the vein every 8 (eight) hours.   chlorhexidine 0.12 % solution Commonly known as: PERIDEX 5 mLs by Mouth Rinse route 2 (two) times daily.   clonazePAM 0.5 MG tablet Commonly known as: KLONOPIN Place 0.5 mg into feeding tube every 12 (twelve) hours as needed for anxiety.   collagenase ointment Commonly known as: SANTYL Apply topically daily. Start taking on: April 23, 2019   docusate sodium 100 MG capsule Commonly known as: COLACE 100 mg See admin instructions. Take one tablet (100 mg) via G-tube every morning   ferrous sulfate 300 (60 Fe) MG/5ML syrup Place  300 mg into feeding tube at bedtime.   heparin 5000 UNIT/ML injection Inject 5,000 Units into the skin every 8 (eight) hours.   HUMULIN R 500 UNIT/ML injection Generic drug: insulin regular human CONCENTRATED Inject 2-10 Units into the skin every 6 (six) hours. CBG 151-200 2 units, 201-250 4 units, 251-300 6 units, 301-350 8 units, 351-400 10 units   LACTOBACILLUS PO Place 1 tablet into feeding tube every 12 (twelve) hours.   lansoprazole 30 MG capsule Commonly known as: PREVACID Place 30 mg into feeding tube at bedtime.   ondansetron 4 MG/2ML Soln injection Commonly known  as: ZOFRAN Inject 4 mg into the vein daily as needed for nausea or vomiting.   sodium bicarbonate 650 MG tablet Place 650 mg into feeding tube daily as needed for heartburn.   sodium chloride flush 0.9 % Soln injection Inject 10 mLs into the vein 2 (two) times daily.   sterile water injection Place 100 mLs into feeding tube every 6 (six) hours. What changed: how much to take        Follow-up appointment   Not applicable, but would consider follow-up CT of chest in 4 weeks to follow-up nodular CT chest findings, however will defer this to primary care providers as not sure he be a candidate for any therapy should his CT remain abnormal Discharge Condition:   Stable   Physician Statement:   The Patient was personally examined, the discharge assessment and plan has been personally reviewed and I agree with ACNP Jatoya Armbrister's assessment and plan. 32 minutes of time have been dedicated to discharge assessment, planning and discharge instructions.   Signed: Clementeen Graham 04/22/2019, 1:03 PM

## 2019-04-22 NOTE — Progress Notes (Addendum)
Patient will be discharged back to [facility] via [facility] around 3pm today. Patient will go to room 309, accepting provider is Dr. Nona Dell. The number to call for report is [phone].  Please send a copy of patient's discharge summary with him at discharge.  Madilyn Fireman, MSW, LCSW-A Transitions of Care  Clinical Social Worker  [facility] Emergency Departments  Medical ICU [phone]

## 2019-04-22 NOTE — Progress Notes (Signed)
Report has been given to [place] at [facility]. Pt remains stable will continue to monitor until departure.

## 2019-04-22 NOTE — Progress Notes (Signed)
NAME:  Alexander Scott, MRN:  [id number], DOB:  [birth year], LOS: 3 ADMISSION DATE:  04/19/2019, CONSULTATION DATE:  04/19/19  CHIEF COMPLAINT:  Hypotension  Brief History   68 yo with history of trach and PEG from [facility] with hypotension and respiratory distress. Admitted for new AKI, hyperkalemia, hypothermia need for for pressors.   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    Significant Hospital Events   10/17 Admit 10/18 Vanc stopped 10/19 Off pressors changing foley cath   Consults:    Procedures:  PTA trach, PEG, rectal tube, foley   Significant Diagnostic Tests:  CT head 04/19/2019- 3 mm SDH.  CT chest, abd, pelvis 04/19/2019-shows multifocal PNA, RUL nodular density, cholelithiasis with mimimal pericholecystic fluod.  Right upper quadrant ultrasound 04/19/19- Adenomyotosis of gallbladder.  No evidence of acute cholecystitis EEG 04/19/19-moderate diffuse encephalopathy.  No seizures.  Micro Data:  10/17 SARS CoV2>> neg 10/17 BCx 2 >> 10/17 UC: klebsiella PNA 10/19 respiratory culture: GNR>>>   Antimicrobials:  10/17 azetreonam 10/17 levaquin  10/17 vancomycin >>10/18 10/17 ceftaz >>  Interim history/subjective:  Looks comfortable  Objective   Blood pressure 110/86, pulse (Abnormal) 109, temperature 98.4 F (36.9 C), temperature source Oral, resp. rate 18, height 5\' 8"  (1.727 m), weight 69.4 kg, SpO2 99 %.    Vent Mode: PRVC FiO2 (%):  [40 %] 40 % Set Rate:  [18 bmp] 18 bmp Vt Set:  [540 mL] 540 mL PEEP:  [5 cmH20] 5 cmH20 Plateau Pressure:  [19 cmH20-26 cmH20] 19  cmH20   Intake/Output Summary (Last 24 hours) at 04/22/2019 0840 Last data filed at 04/22/2019 0800 Gross per 24 hour  Intake 1910.07 ml  Output 3865 ml  Net -1954.93 ml   Filed Weights   04/20/19 0500 04/21/19 0140 04/22/19 0325  Weight: 70.1 kg 71.1 kg 69.4 kg    Examination:  General 68 year old black male. Remains vent dependent. Not in distress HENT poor dentition. Trach site unremarkable MMM Pulm scattered coarse rhonchi Card mild tachycardia  abd soft PEG unremarkable liq stool GU clear yellow  Neuro gross motor uppers no movement of lowers. Grimaces but does not follow commands or attempt to communicate EXT generalized anasarca pulses strong. Prevalon boots to [facility] Problem list   Septic shock (resolved 10/18) AKI Hyponatremia   Assessment & Plan:   Severe sepsis 2/2 UT source. + probable VAP (NOS to date) -now off pressors Growing K PNA in urine.  Plan Day 4 ceftaz (awaiting final sensitivities but prelim not a esbl producer so no need to widen) Consider f/u CT chest at some point to f/u pulm nodules although even if still present he would not be an candidate for cancer treatment   Chronic respiratory failure, trach pcxr personally reviewed: RLL infiltrate a little worse. Otherwise no sig change. Support tubing in satisfactory position  Plan Cont full vent support; PSV as tolerated VAP bundle   Fluid and electrolyte imbalance: hyperkalemia, hypercalcemia -not clear what is driving the hypercalcemia.   Plan Repeat chem this am (got lasix 10/19)  Quadriplegia w/ gross UE movement.  Still has episodes of tachycardia and fever swings Plan Cont symmetrel and clonazepam   Sacral wound-Patent with stage  3 Plan WOC placed.  prevalon boots to LEs Santyl to sacral ulcer  Small subdural on CT scan Reviewed with neurosurgery.   Plan No intervention warranted  Anemia of chronic disease -do not see evidence of bleeding Plan Repeat  CBC in am  Transfuse for hgb < 7  Best practice:  Diet: Tube feeds Pain/Anxiety/Delirium protocol (if indicated): Fentanyl PRN VAP protocol (if indicated):HOB 30 degrees DVT prophylaxis: Heparin GI prophylaxis: Protonix Glucose control: SSI Mobility: Not indicated at this time Code Status: Full as unable to attain previous DNR forms. This has been noted on previous admission. Unable to contact son Family Communication: Multiple attempts to call son were unsucessful. Left voice message 10/18 Disposition: shock state resolved. Pan sensitive urine culture. Sputum pending. I think he can go back to [facility]. Will work on this.   Erick Colace ACNP-BC [place] Pager # [phone] OR # [phone] if no answer    04/22/2019, 8:40 AM

## 2019-04-23 LAB — CULTURE, RESPIRATORY W GRAM STAIN

## 2019-04-24 LAB — CULTURE, BLOOD (ROUTINE X 2)
Culture: NO GROWTH
Culture: NO GROWTH
Special Requests: ADEQUATE
Special Requests: ADEQUATE

## 2019-06-03 DEATH — deceased

## 2021-02-14 IMAGING — DX PORTABLE CHEST - 1 VIEW
1 series · 2 of 2 positions shown · non-contrast
Comparison: 02/19/2019

CLINICAL DATA: Respiratory failure

EXAM:
PORTABLE CHEST 1 VIEW

[Series 1: chest · 0.14mm/px · 2 of 2 slices shown]
[im 1/2]
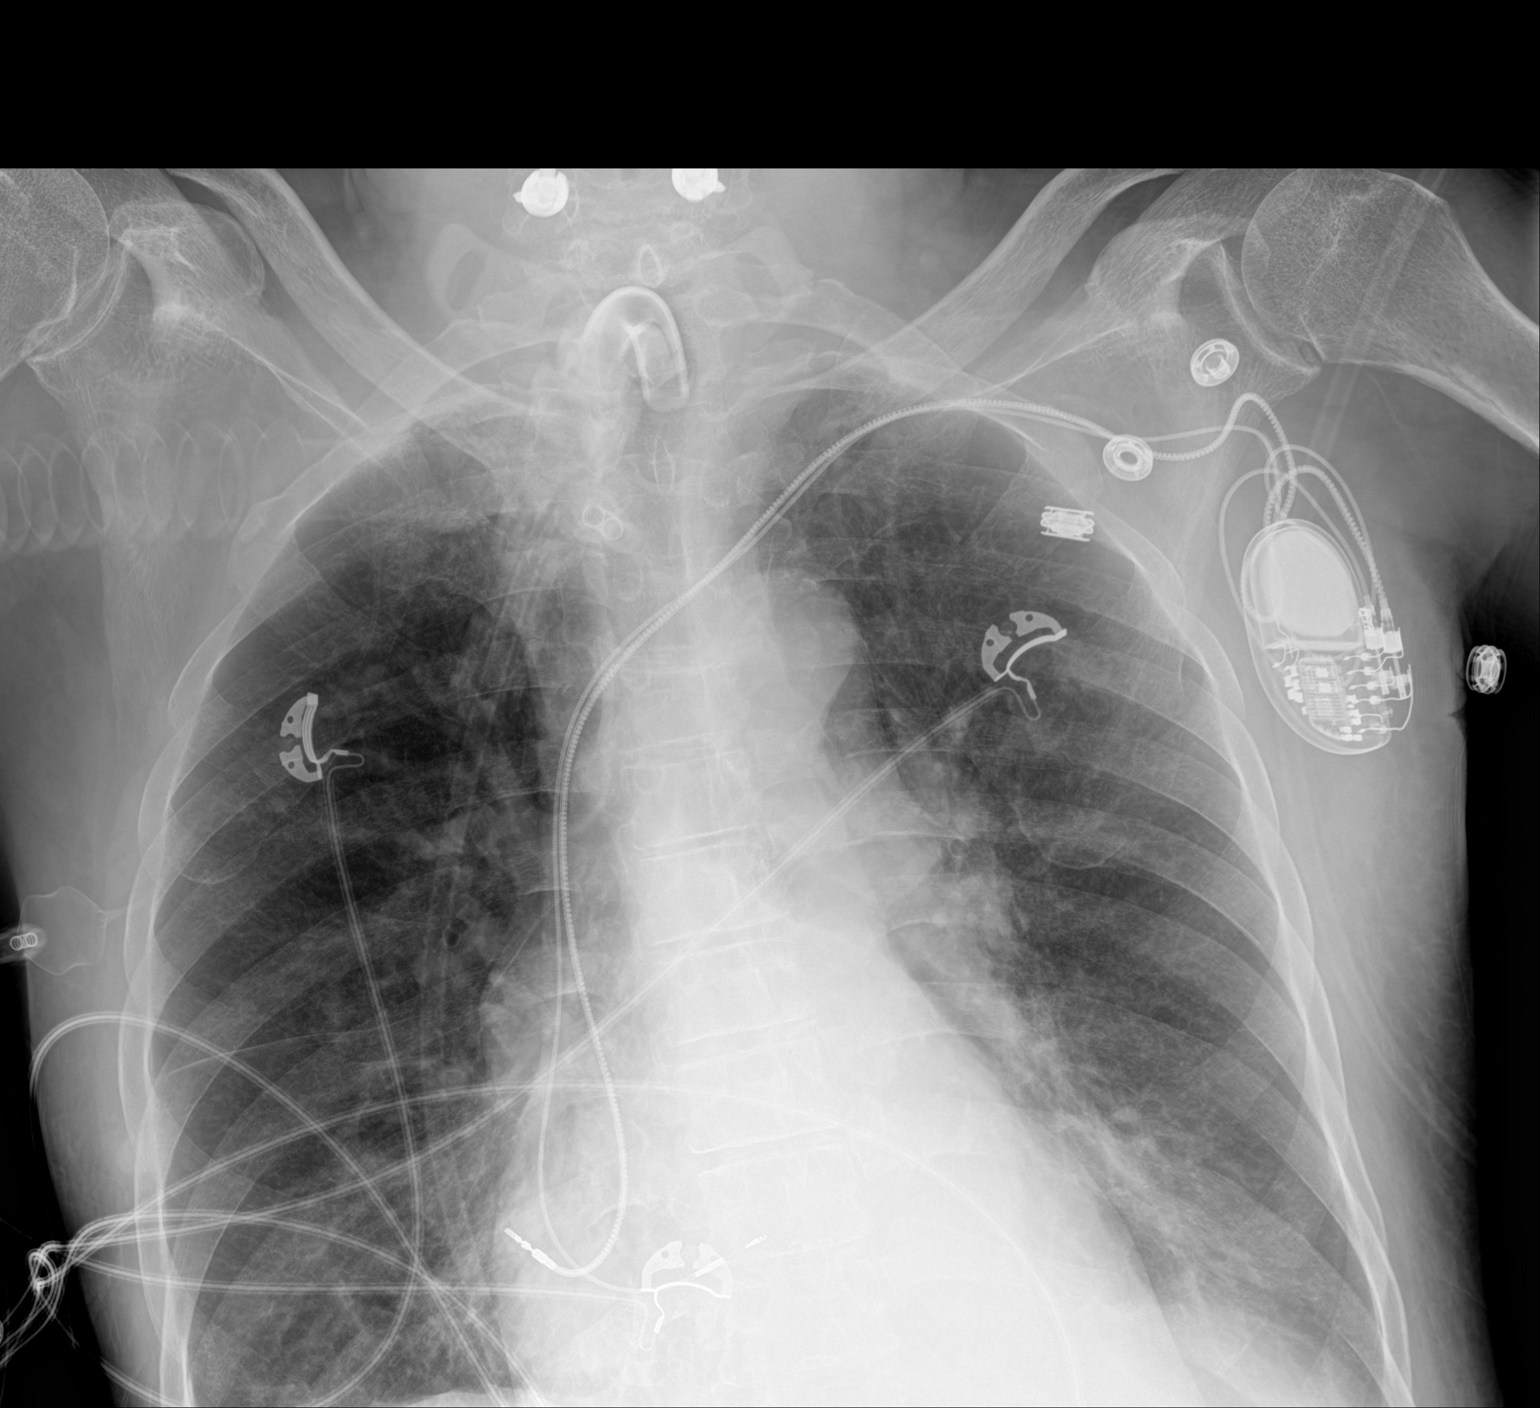
[im 2/2]
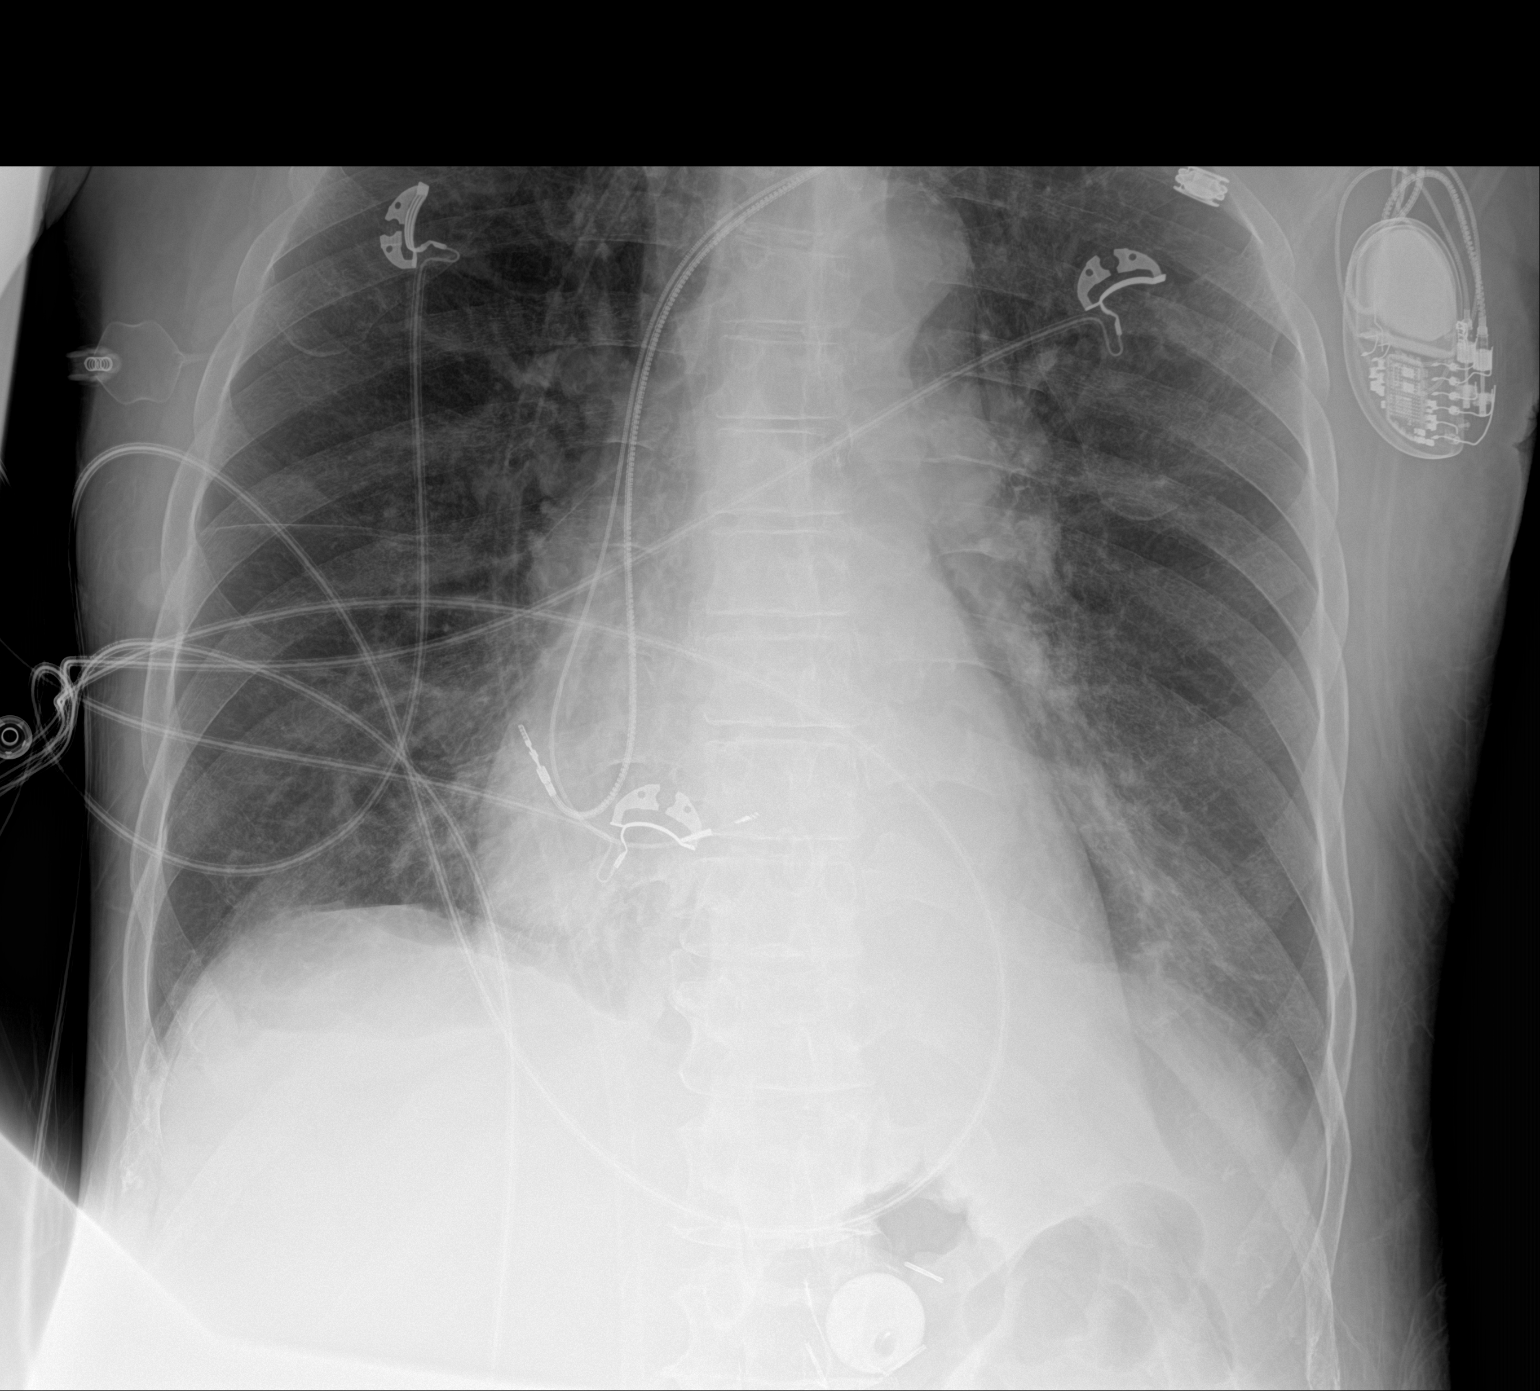

[2 of 2 positions shown; findings below may reference images not displayed]

FINDINGS: Tracheostomy and left pacer remain in place, unchanged. Heart is
normal size. Bilateral perihilar and lower lobe airspace opacities
are similar to prior study. No visible effusions. No acute bony
abnormality.
IMPRESSION: Bilateral perihilar/lower lobe atelectasis or infiltrates.

## 2021-04-14 IMAGING — CT CT ABD-PELV W/ CM
2 of 5 series · 12 of 46 positions shown, 14 images · IV contrast (omnipaque)
Comparison: None.

CLINICAL DATA: Hypotension. Shortness of breath. Evaluate for
pulmonary embolism and for intra-abdominal pathology.

EXAM:
CT ANGIOGRAPHY CHEST
CT ABDOMEN AND PELVIS WITH CONTRAST
TECHNIQUE: Multidetector CT imaging of the chest was performed using the
standard protocol during bolus administration of intravenous
contrast. Multiplanar CT image reconstructions and MIPs were
obtained to evaluate the vascular anatomy. Multidetector CT imaging
of the abdomen and pelvis was performed using the standard protocol
during bolus administration of intravenous contrast.
CONTRAST:  100mL OMNIPAQUE IOHEXOL 350 MG/ML SOLN

[Series 4: abdomen 5.0 · axial · 0.75mm/px · z∈[+818,+1204]mm · 9 of 91 slices shown, 11 images]
[im 7/91  soft-tissue]
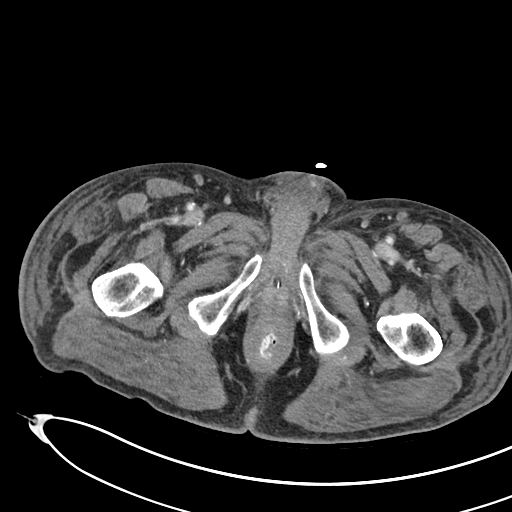
[im 7/91  bone]
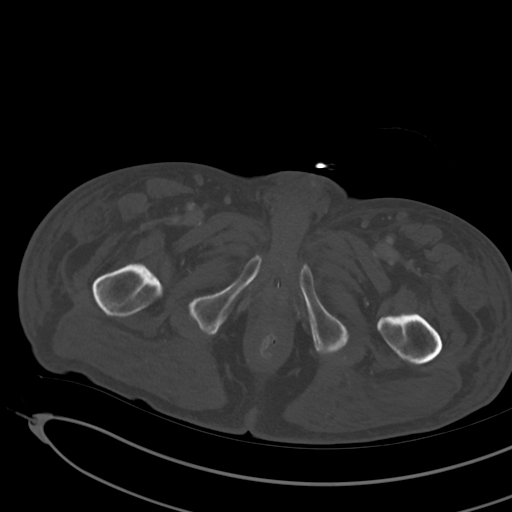
[im 20/91  soft-tissue]
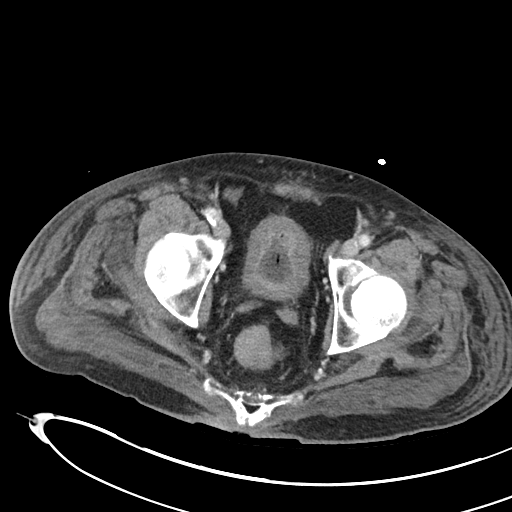
[im 26/91  soft-tissue]
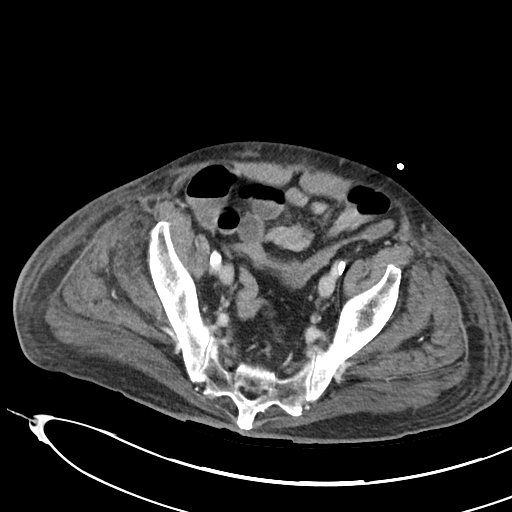
[im 39/91  soft-tissue]
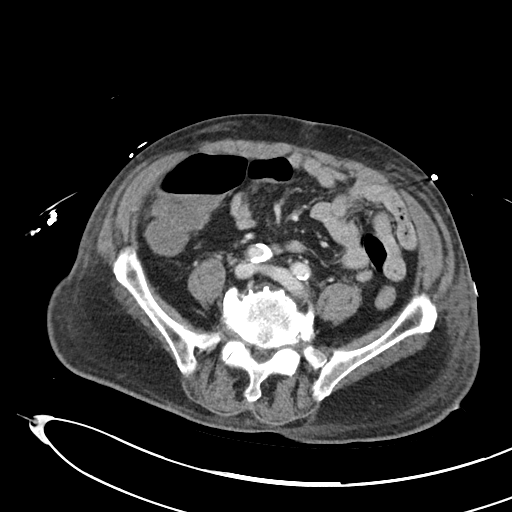
[im 46/91  soft-tissue]
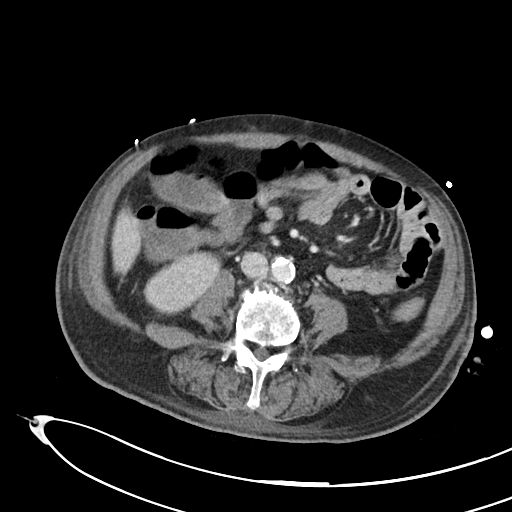
[im 52/91  soft-tissue]
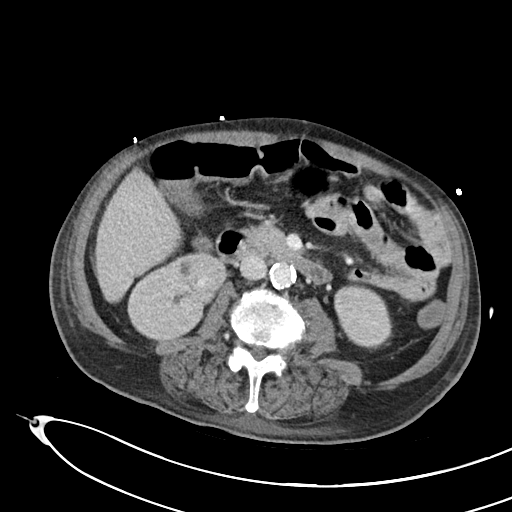
[im 65/91  soft-tissue]
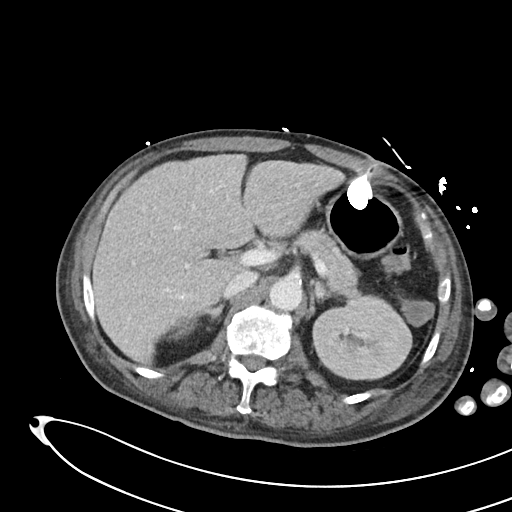
[im 71/91  soft-tissue]
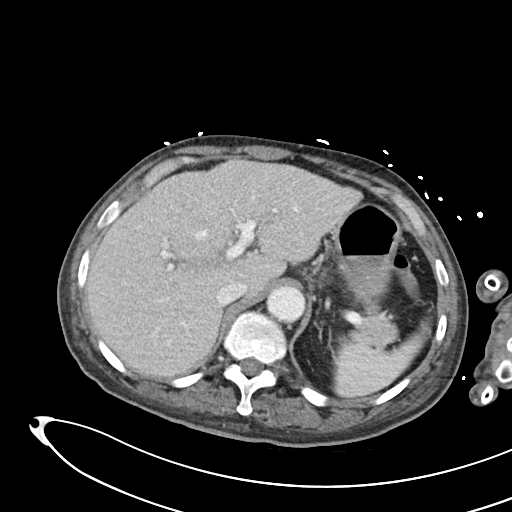
[im 84/91  soft-tissue]
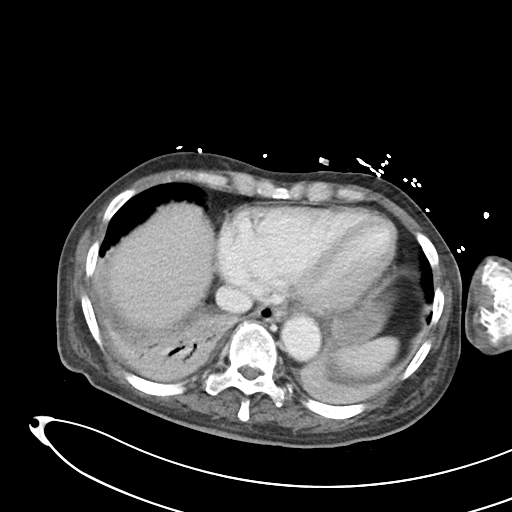
[im 84/91  bone]
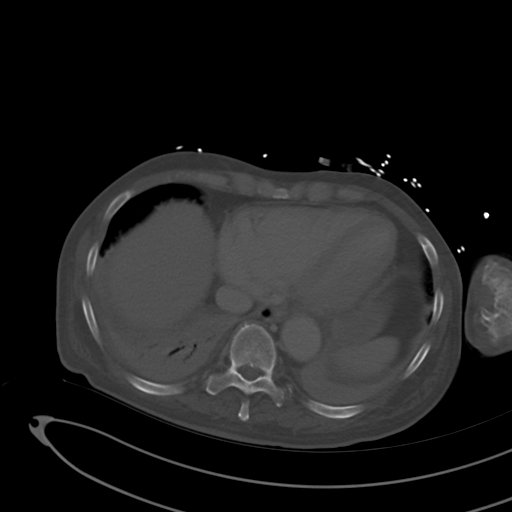

[Series 7: abdomen 3.0 mpr cor · coronal · 0.80mm/px · 3 of 80 slices shown]
[im 27/80  soft-tissue]
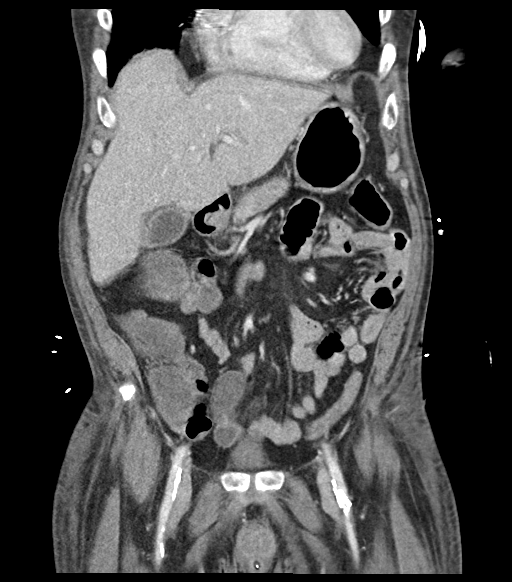
[im 36/80  soft-tissue]
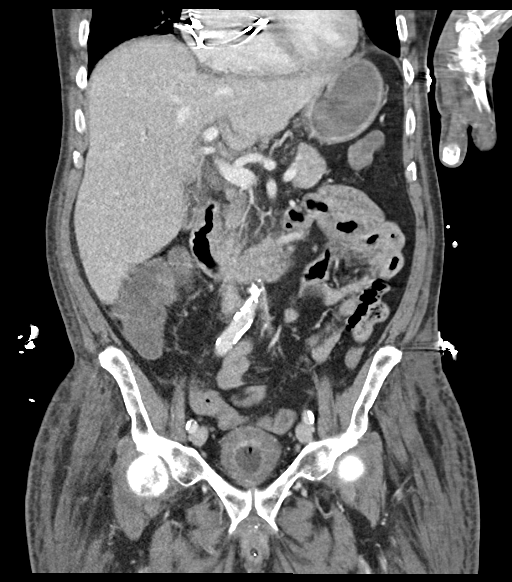
[im 44/80  soft-tissue]
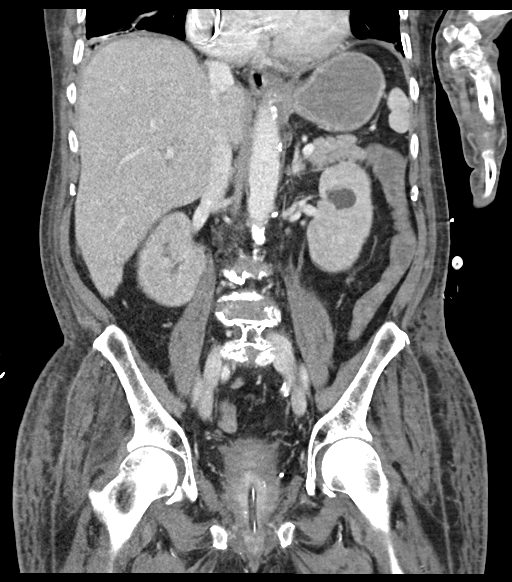

[12 of 46 positions shown; findings below may reference images not displayed]

FINDINGS: CTA CHEST FINDINGS

Vascular Findings:

There is adequate opacification of the pulmonary arterial system
with the main pulmonary artery measuring 309 Hounsfield units. There
are no discrete filling defects within the pulmonary arterial tree
to suggest pulmonary embolism. Normal caliber the main pulmonary
artery.

Cardiomegaly. Pacer leads terminate within the right atrium and
ventricle. No pericardial effusion. Coronary artery calcifications.

No evidence of thoracic aortic aneurysm or dissection on this
nongated examination.

Conventional configuration of the aortic arch. The branch vessels of
the aortic arch appear patent throughout their imaged courses.

Review of the MIP images confirms the above findings.

----------------------------------------------------------------------------------

Nonvascular Findings:

Mediastinum/Lymph Nodes: Mediastinal and bilateral hilar
lymphadenopathy with measuring 1 cm greatest short index subcarinal
lymph node axis diameter (image 67, series 4, index right suprahilar
lymph node measuring 1.0 cm (image 69) and index left infrahilar
lymph node measuring 1.1 cm (image 80), potentially reactive in
etiology. No axillary lymphadenopathy

Lungs/Pleura: There is complete consolidation of the bilateral lower
lobes with associated air bronchograms. There are additional
ill-defined heterogeneous airspace opacities within the right upper
lobe (representative image 91, series 5). Trace bilateral pleural
effusions.

There is a spiculated nodule within right upper lobe which measures
approximately 1.1 x 0.7 cm (image 58, series 5), potentially another
area of infection though discrete underlying pulmonary nodule is not
excluded.

Tracheostomy tube terminates superior to the carina. The central
pulmonary airways appear patent. No pneumothorax.

Musculoskeletal: No acute or aggressive osseous abnormalities.
Degenerative change of the lower cervical spine.

_________________________________________________________

CT ABDOMEN and PELVIS FINDINGS

Hepatobiliary: There is mild nodularity hepatic contour as could be
seen in the setting of early cirrhotic change. No discrete hepatic
lesions.

Scattered punctate (2-3 mm) calcifications are seen within the
gallbladder suggestive of cholelithiasis. There is a minimal amount
of suspected pericholecystic fluid a (representative coronal image
24, series 7), without associated gallbladder wall thickening.

No intra or extrahepatic biliary ductal dilatation.  No ascites.

Pancreas: Normal appearance of the pancreas.

Spleen: Normal appearance of the spleen.

Adrenals/Urinary Tract: There is symmetric enhancement and excretion
of the bilateral kidneys. Note is made of an approximately 3.4 x
cm hypoattenuating (20 Hounsfield unit) nonenhancing cyst within the
anterior interpolar aspect the left kidney. Subcentimeter
hypoattenuating right-sided renal lesions are too small to
accurately characterize though favored to represent additional renal
cysts. No definite renal stones this postcontrast examination. No
urine obstruction or perinephric stranding.

Normal appearance of the bilateral adrenal glands.

The urinary bladder is decompressed with a Foley catheter.

Stomach/Bowel: Appropriately positioned rectal and gastrostomy tube.
No evidence of enteric obstruction. No discrete areas of bowel wall
thickening. Surgical clips are seen adjacent to the cecum, likely
the sequela of previous appendectomy. Normal appearance of the
terminal ileum. No pneumoperitoneum, pneumatosis or portal venous
gas.

Vascular/Lymphatic: Moderate to large amount of mixed calcified and
noncalcified atherosclerotic plaque throughout the abdominal aorta.
The major branch vessels of the abdominal aorta appear patent on
this non CTA examination.

No bulky retroperitoneal, mesenteric, pelvic or inguinal
lymphadenopathy.

Reproductive: Normal appearance of the prostate gland. No free fluid
the pelvic cul-de-sac.

Other: Diffuse body wall anasarca. Note is made of a sacral
decubitus ulcer which measures approximately 5.6 cm in diameter
(image 76, series 4) with suspected osteolysis involving the
right-side of the coccyx (image 74, series 4).

Musculoskeletal: As above, there is a sacral decubitus ulcer with
suspected osteolysis involving the right-side of the coccyx is as
could be seen in the setting osteomyelitis.

Mild-to-moderate multilevel lumbar spine DDD.

Review of the MIP images confirms the above findings.
IMPRESSION: Chest CTA impression:

1. No evidence of pulmonary embolism.
2. Consolidation of the bilateral lower lobes with associated air
bronchograms, potentially atelectasis though worrisome for
multifocal infection.
3. Ill-defined consolidative opacities within the right upper lobe
worrisome for additional areas of infection and/or aspiration.
4. Approximately 1.1 cm spiculated nodular opacity within right
upper lobe, potentially an additional area of infection and/or
aspiration though a discrete underlying pulmonary nodule at this
location is not excluded. A follow-up chest CT in 4-6 weeks after
the resolution of acute symptoms is advised.
5. Presumably reactive borderline enlarged mediastinal and bilateral
hilar lymph nodes - attention on follow-up is recommended.
6. Cardiomegaly.
7. Coronary calcifications.  Aortic Atherosclerosis (GG4D3-1F6.6).

Abdomen and pelvic CT impression:

1. Cholelithiasis with minimal amount of pericholecystic fluid but
without definitive evidence of gallbladder wall thickening. Clinical
correlation for symptoms of acute cholecystitis is advised. Further
evaluation could be performed with right upper quadrant abdominal
ultrasound and/or nuclear medicine HIDA scan as indicated.
2. Otherwise, no acute findings within the abdomen or pelvis.
3. Note is made of a sacral decubitus ulcer with suspected
osteolysis involving the right-side of the coccyx as could be seen
in the setting of osteomyelitis.
4. Appropriately positioned gastrostomy and rectal tube. No evidence
of enteric obstruction.
5. Nodularity of the hepatic contour as could be seen in the setting
cirrhotic change. Correlation with LFTs is advised.
6.  Aortic Atherosclerosis (GG4D3-1F6.6).

## 2021-04-14 IMAGING — CT CT ANGIO CHEST
2 of 7 series · 14 of 46 positions shown · IV contrast (APPLIED)
Comparison: None.

CLINICAL DATA: Hypotension. Shortness of breath. Evaluate for
pulmonary embolism and for intra-abdominal pathology.

EXAM:
CT ANGIOGRAPHY CHEST
CT ABDOMEN AND PELVIS WITH CONTRAST
TECHNIQUE: Multidetector CT imaging of the chest was performed using the
standard protocol during bolus administration of intravenous
contrast. Multiplanar CT image reconstructions and MIPs were
obtained to evaluate the vascular anatomy. Multidetector CT imaging
of the abdomen and pelvis was performed using the standard protocol
during bolus administration of intravenous contrast.
CONTRAST:  100mL OMNIPAQUE IOHEXOL 350 MG/ML SOLN

[Series 6: thins · axial · 0.65mm/px · z∈[+1151,+1411]mm · 11 of 420 slices shown]
[im 24/420  lung]
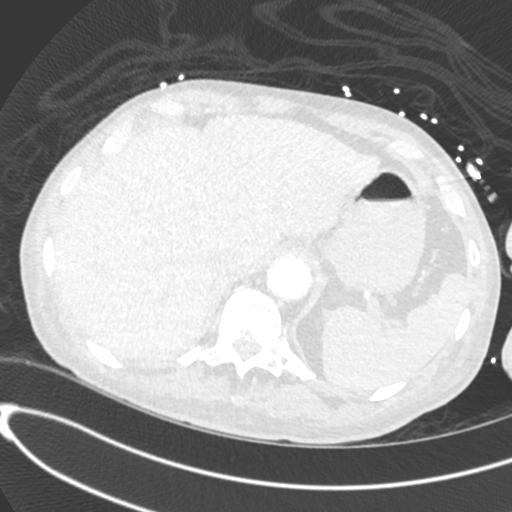
[im 70/420  soft-tissue]
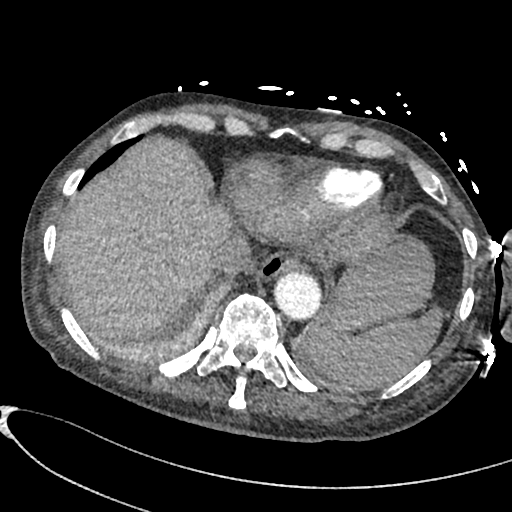
[im 94/420  lung]
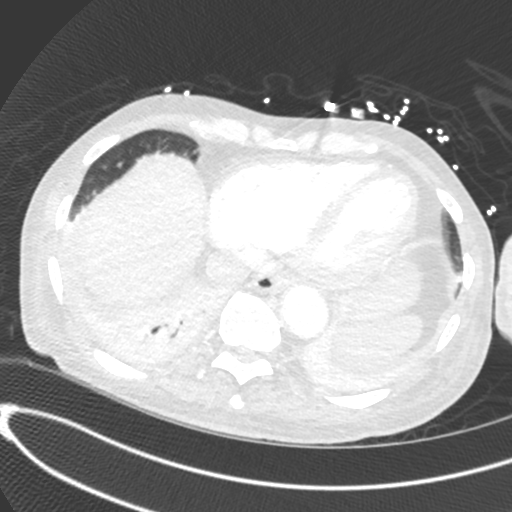
[im 140/420  soft-tissue]
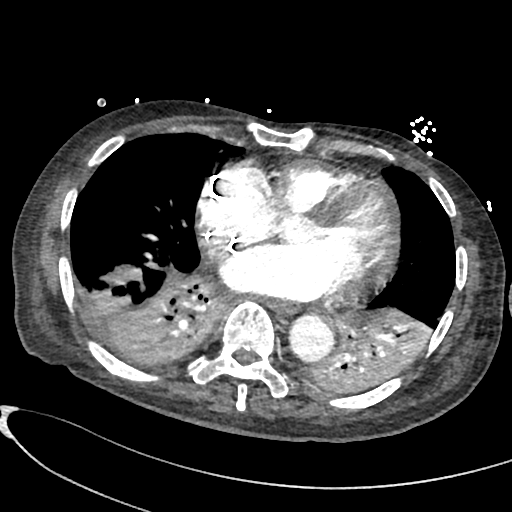
[im 163/420  lung]
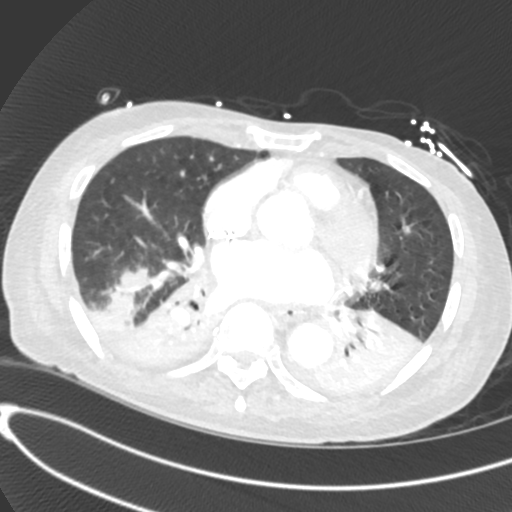
[im 210/420  soft-tissue]
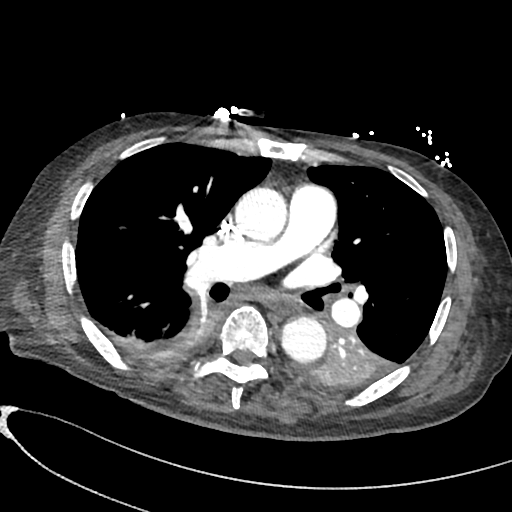
[im 257/420  lung]
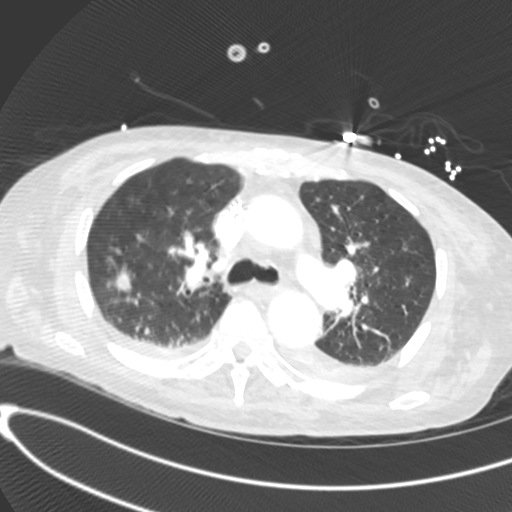
[im 280/420  soft-tissue]
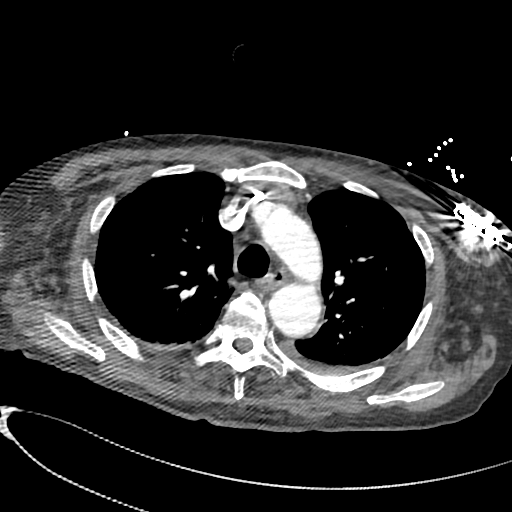
[im 326/420  lung]
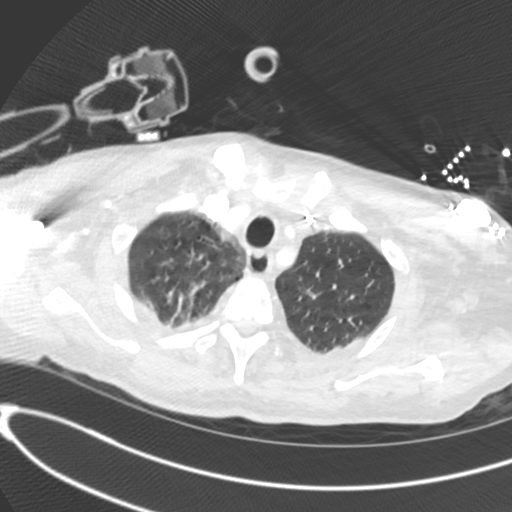
[im 350/420  soft-tissue]
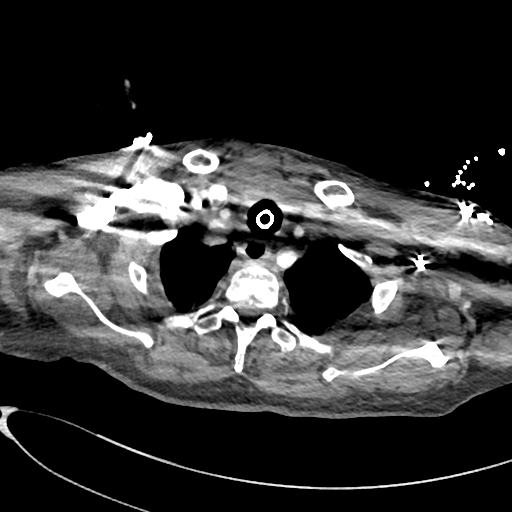
[im 396/420  lung]
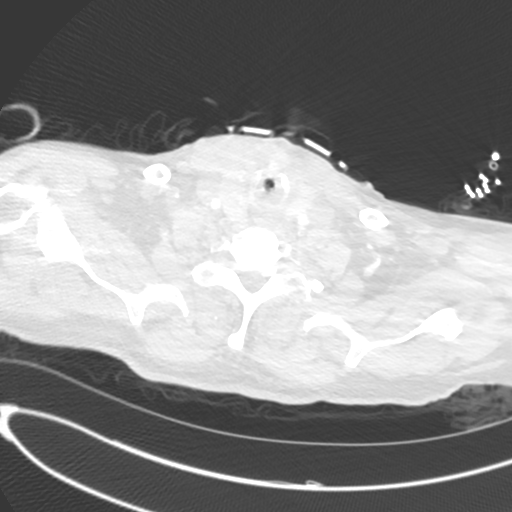

[Series 7: cor · coronal · 0.64mm/px · 3 of 98 slices shown]
[im 25/98  soft-tissue]
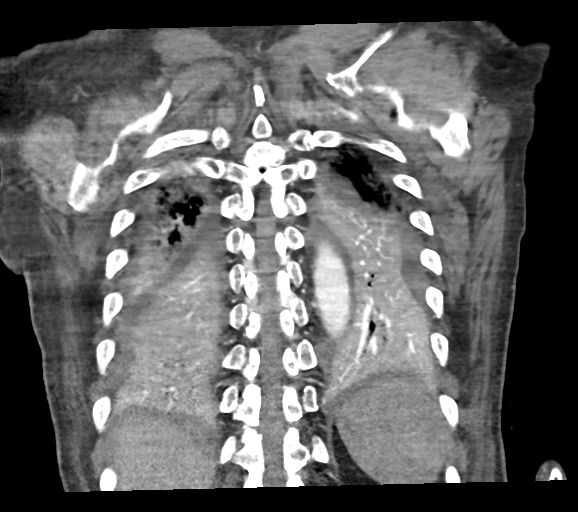
[im 49/98  soft-tissue]
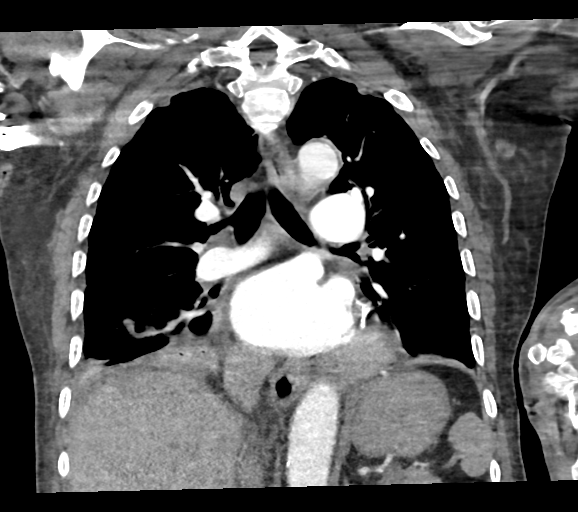
[im 73/98  soft-tissue]
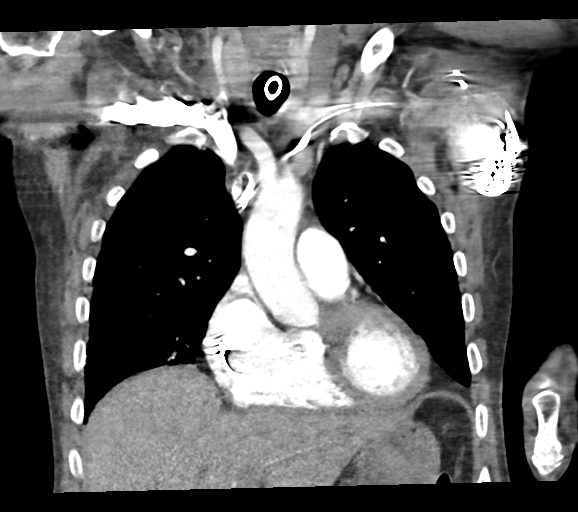

[14 of 46 positions shown; findings below may reference images not displayed]

FINDINGS: CTA CHEST FINDINGS

Vascular Findings:

There is adequate opacification of the pulmonary arterial system
with the main pulmonary artery measuring 309 Hounsfield units. There
are no discrete filling defects within the pulmonary arterial tree
to suggest pulmonary embolism. Normal caliber the main pulmonary
artery.

Cardiomegaly. Pacer leads terminate within the right atrium and
ventricle. No pericardial effusion. Coronary artery calcifications.

No evidence of thoracic aortic aneurysm or dissection on this
nongated examination.

Conventional configuration of the aortic arch. The branch vessels of
the aortic arch appear patent throughout their imaged courses.

Review of the MIP images confirms the above findings.

----------------------------------------------------------------------------------

Nonvascular Findings:

Mediastinum/Lymph Nodes: Mediastinal and bilateral hilar
lymphadenopathy with measuring 1 cm greatest short index subcarinal
lymph node axis diameter (image 67, series 4, index right suprahilar
lymph node measuring 1.0 cm (image 69) and index left infrahilar
lymph node measuring 1.1 cm (image 80), potentially reactive in
etiology. No axillary lymphadenopathy

Lungs/Pleura: There is complete consolidation of the bilateral lower
lobes with associated air bronchograms. There are additional
ill-defined heterogeneous airspace opacities within the right upper
lobe (representative image 91, series 5). Trace bilateral pleural
effusions.

There is a spiculated nodule within right upper lobe which measures
approximately 1.1 x 0.7 cm (image 58, series 5), potentially another
area of infection though discrete underlying pulmonary nodule is not
excluded.

Tracheostomy tube terminates superior to the carina. The central
pulmonary airways appear patent. No pneumothorax.

Musculoskeletal: No acute or aggressive osseous abnormalities.
Degenerative change of the lower cervical spine.

_________________________________________________________

CT ABDOMEN and PELVIS FINDINGS

Hepatobiliary: There is mild nodularity hepatic contour as could be
seen in the setting of early cirrhotic change. No discrete hepatic
lesions.

Scattered punctate (2-3 mm) calcifications are seen within the
gallbladder suggestive of cholelithiasis. There is a minimal amount
of suspected pericholecystic fluid a (representative coronal image
24, series 7), without associated gallbladder wall thickening.

No intra or extrahepatic biliary ductal dilatation.  No ascites.

Pancreas: Normal appearance of the pancreas.

Spleen: Normal appearance of the spleen.

Adrenals/Urinary Tract: There is symmetric enhancement and excretion
of the bilateral kidneys. Note is made of an approximately 3.4 x
cm hypoattenuating (20 Hounsfield unit) nonenhancing cyst within the
anterior interpolar aspect the left kidney. Subcentimeter
hypoattenuating right-sided renal lesions are too small to
accurately characterize though favored to represent additional renal
cysts. No definite renal stones this postcontrast examination. No
urine obstruction or perinephric stranding.

Normal appearance of the bilateral adrenal glands.

The urinary bladder is decompressed with a Foley catheter.

Stomach/Bowel: Appropriately positioned rectal and gastrostomy tube.
No evidence of enteric obstruction. No discrete areas of bowel wall
thickening. Surgical clips are seen adjacent to the cecum, likely
the sequela of previous appendectomy. Normal appearance of the
terminal ileum. No pneumoperitoneum, pneumatosis or portal venous
gas.

Vascular/Lymphatic: Moderate to large amount of mixed calcified and
noncalcified atherosclerotic plaque throughout the abdominal aorta.
The major branch vessels of the abdominal aorta appear patent on
this non CTA examination.

No bulky retroperitoneal, mesenteric, pelvic or inguinal
lymphadenopathy.

Reproductive: Normal appearance of the prostate gland. No free fluid
the pelvic cul-de-sac.

Other: Diffuse body wall anasarca. Note is made of a sacral
decubitus ulcer which measures approximately 5.6 cm in diameter
(image 76, series 4) with suspected osteolysis involving the
right-side of the coccyx (image 74, series 4).

Musculoskeletal: As above, there is a sacral decubitus ulcer with
suspected osteolysis involving the right-side of the coccyx is as
could be seen in the setting osteomyelitis.

Mild-to-moderate multilevel lumbar spine DDD.

Review of the MIP images confirms the above findings.
IMPRESSION: Chest CTA impression:

1. No evidence of pulmonary embolism.
2. Consolidation of the bilateral lower lobes with associated air
bronchograms, potentially atelectasis though worrisome for
multifocal infection.
3. Ill-defined consolidative opacities within the right upper lobe
worrisome for additional areas of infection and/or aspiration.
4. Approximately 1.1 cm spiculated nodular opacity within right
upper lobe, potentially an additional area of infection and/or
aspiration though a discrete underlying pulmonary nodule at this
location is not excluded. A follow-up chest CT in 4-6 weeks after
the resolution of acute symptoms is advised.
5. Presumably reactive borderline enlarged mediastinal and bilateral
hilar lymph nodes - attention on follow-up is recommended.
6. Cardiomegaly.
7. Coronary calcifications.  Aortic Atherosclerosis (GG4D3-1F6.6).

Abdomen and pelvic CT impression:

1. Cholelithiasis with minimal amount of pericholecystic fluid but
without definitive evidence of gallbladder wall thickening. Clinical
correlation for symptoms of acute cholecystitis is advised. Further
evaluation could be performed with right upper quadrant abdominal
ultrasound and/or nuclear medicine HIDA scan as indicated.
2. Otherwise, no acute findings within the abdomen or pelvis.
3. Note is made of a sacral decubitus ulcer with suspected
osteolysis involving the right-side of the coccyx as could be seen
in the setting of osteomyelitis.
4. Appropriately positioned gastrostomy and rectal tube. No evidence
of enteric obstruction.
5. Nodularity of the hepatic contour as could be seen in the setting
cirrhotic change. Correlation with LFTs is advised.
6.  Aortic Atherosclerosis (GG4D3-1F6.6).
